# Patient Record
Sex: Male | Born: 1965 | Race: White | Hispanic: No | Marital: Married | Smoking: Never smoker
Health system: Southern US, Community
[De-identification: ages and names within clinical notes are randomized; demographics above are authoritative.]

## PROBLEM LIST (undated history)

## (undated) DIAGNOSIS — M542 Cervicalgia: Secondary | ICD-10-CM

## (undated) DIAGNOSIS — M519 Unspecified thoracic, thoracolumbar and lumbosacral intervertebral disc disorder: Secondary | ICD-10-CM

## (undated) DIAGNOSIS — G8929 Other chronic pain: Secondary | ICD-10-CM

## (undated) DIAGNOSIS — R945 Abnormal results of liver function studies: Secondary | ICD-10-CM

## (undated) DIAGNOSIS — I10 Essential (primary) hypertension: Secondary | ICD-10-CM

## (undated) DIAGNOSIS — R7989 Other specified abnormal findings of blood chemistry: Secondary | ICD-10-CM

## (undated) DIAGNOSIS — E119 Type 2 diabetes mellitus without complications: Secondary | ICD-10-CM

## (undated) DIAGNOSIS — R55 Syncope and collapse: Secondary | ICD-10-CM

## (undated) DIAGNOSIS — K5790 Diverticulosis of intestine, part unspecified, without perforation or abscess without bleeding: Secondary | ICD-10-CM

## (undated) HISTORY — DX: Unspecified thoracic, thoracolumbar and lumbosacral intervertebral disc disorder: M51.9

## (undated) HISTORY — PX: KNEE ARTHROCENTESIS: SUR44

## (undated) HISTORY — DX: Essential (primary) hypertension: I10

## (undated) HISTORY — DX: Type 2 diabetes mellitus without complications: E11.9

---

## 2007-11-29 ENCOUNTER — Emergency Department (HOSPITAL_COMMUNITY): Admission: EM | Admit: 2007-11-29 | Discharge: 2007-11-29 | Payer: Self-pay | Admitting: Emergency Medicine

## 2008-10-21 ENCOUNTER — Emergency Department: Payer: Self-pay | Admitting: Emergency Medicine

## 2010-09-06 HISTORY — PX: COLONOSCOPY: SHX174

## 2010-10-06 HISTORY — PX: COLONOSCOPY: SHX174

## 2012-06-07 HISTORY — PX: ROTATOR CUFF REPAIR W/ DISTAL CLAVICLE EXCISION: SHX2365

## 2012-11-24 ENCOUNTER — Ambulatory Visit (INDEPENDENT_AMBULATORY_CARE_PROVIDER_SITE_OTHER): Payer: BC Managed Care – PPO | Admitting: Gastroenterology

## 2012-11-24 ENCOUNTER — Encounter: Payer: Self-pay | Admitting: Gastroenterology

## 2012-11-24 VITALS — BP 116/76 | HR 76 | Temp 98.5°F | Ht 72.0 in | Wt 233.8 lb

## 2012-11-24 DIAGNOSIS — K6289 Other specified diseases of anus and rectum: Secondary | ICD-10-CM

## 2012-11-24 DIAGNOSIS — K649 Unspecified hemorrhoids: Secondary | ICD-10-CM

## 2012-11-24 MED ORDER — NITROGLYCERIN 0.4 % RE OINT: 1.0000 [in_us] | TOPICAL_OINTMENT | Freq: Two times a day (BID) | RECTAL | Status: DC

## 2012-11-24 MED ORDER — LIDOCAINE-HYDROCORTISONE ACE 3-1 % RE KIT: 1.0000 "application " | PACK | Freq: Two times a day (BID) | RECTAL | Status: DC

## 2012-11-24 NOTE — Assessment & Plan Note (Addendum)
47 y/o male with anorectal pain associated with BMs, intermittent rectal bleeding. TCS 2 years ago at [facility] reassuring. Patient describes having hemorrhoids that prolapse at times but current symptoms have been protracted regarding the pain. DRE limited due to patient discomfort. Suspect hemorrhoids +/- anorectal fissure. See patient instructions below.   1. First try Anamantle Forte rectally twice a day for two weeks. This has a numbing medication and antiinflammatory component to help calm down hemorrhoids. If you continue to have significant rectal pain, you may have a small tear in your rectum called a fissure. If you continue to have ongoing pain, you should then try Rectiv to relax your muscles and allowing for healing. This medication should be continued for at least 3-4 weeks.  2. If you do try Rectiv, be sure to use a gloved finger to apply the medication. You may need to take Tylenol before you use the medication to help prevent headache.  3. OV in 3 weeks. If doing better, consider scheduling for [facility] hemorrhoid banding.

## 2012-11-24 NOTE — Patient Instructions (Addendum)
1. First try Anamantle Forte rectally twice a day for two weeks. This has a numbing medication and antiinflammatory component to help calm down hemorrhoids. If you continue to have significant rectal pain, you may have a small tear in your rectum called a fissure. If you continue to have ongoing pain, you should then try Rectiv to relax your muscles and allowing for healing. This medication should be continued for at least 3-4 weeks.  2. If you do try Rectiv, be sure to use a gloved finger to apply the medication. You may need to take Tylenol before you use the medication to help prevent headache.   Anal Fissure, Adult An anal fissure is a small tear or crack in the skin around the anus. Bleeding from a fissure usually stops on its own within a few minutes. However, bleeding will often reoccur with each bowel movement until the crack heals.  CAUSES   Passing large, hard stools.  Frequent diarrheal stools.  Constipation.  Inflammatory bowel disease (Crohn's disease or ulcerative colitis).  Infections.  Anal sex. SYMPTOMS   Small amounts of blood seen on your stools, on toilet paper, or in the toilet after a bowel movement.  Rectal bleeding.  Painful bowel movements.  Itching or irritation around the anus. DIAGNOSIS Your caregiver will examine the anal area. An anal fissure can usually be seen with careful inspection. A rectal exam may be performed and a short tube (anoscope) may be used to examine the anal canal. TREATMENT   You may be instructed to take fiber supplements. These supplements can soften your stool to help make bowel movements easier.  Sitz baths may be recommended to help heal the tear. Do not use soap in the sitz baths.  A medicated cream or ointment may be prescribed to lessen discomfort. HOME CARE INSTRUCTIONS   Maintain a diet high in fruits, whole grains, and vegetables. Avoid constipating foods like bananas and dairy products.  Take sitz baths as directed  by your caregiver.  Drink enough fluids to keep your urine clear or pale yellow.  Only take over-the-counter or prescription medicines for pain, discomfort, or fever as directed by your caregiver. Do not take aspirin as this may increase bleeding.  Do not use ointments containing numbing medications (anesthetics) or hydrocortisone. They could slow healing. SEEK MEDICAL CARE IF:   Your fissure is not completely healed within 3 days.  You have further bleeding.  You have a fever.  You have diarrhea mixed with blood.  You have pain.  Your problem is getting worse rather than better. MAKE SURE YOU:   Understand these instructions.  Will watch your condition.  Will get help right away if you are not doing well or get worse. Document Released: 05/24/2005 Document Revised: 08/16/2011 Document Reviewed: 11/08/2010 [facility] Patient Information 2014 [place], [place].  Hemorrhoids Hemorrhoids are swollen veins around the rectum or anus. There are two types of hemorrhoids:   Internal hemorrhoids. These occur in the veins just inside the rectum. They may poke through to the outside and become irritated and painful.  External hemorrhoids. These occur in the veins outside the anus and can be felt as a painful swelling or hard lump near the anus. CAUSES  Pregnancy.   Obesity.   Constipation or diarrhea.   Straining to have a bowel movement.   Sitting for long periods on the toilet.  Heavy lifting or other activity that caused you to strain.  Anal intercourse. SYMPTOMS   Pain.   Anal itching  or irritation.   Rectal bleeding.   Fecal leakage.   Anal swelling.   One or more lumps around the anus.  DIAGNOSIS  Your caregiver may be able to diagnose hemorrhoids by visual examination. Other examinations or tests that may be performed include:   Examination of the rectal area with a gloved hand (digital rectal exam).   Examination of anal canal using a small  tube (scope).   A blood test if you have lost a significant amount of blood.  A test to look inside the colon (sigmoidoscopy or colonoscopy). TREATMENT Most hemorrhoids can be treated at home. However, if symptoms do not seem to be getting better or if you have a lot of rectal bleeding, your caregiver may perform a procedure to help make the hemorrhoids get smaller or remove them completely. Possible treatments include:   Placing a rubber band at the base of the hemorrhoid to cut off the circulation (rubber band ligation).   Injecting a chemical to shrink the hemorrhoid (sclerotherapy).   Using a tool to burn the hemorrhoid (infrared light therapy).   Surgically removing the hemorrhoid (hemorrhoidectomy).   Stapling the hemorrhoid to block blood flow to the tissue (hemorrhoid stapling).  HOME CARE INSTRUCTIONS   Eat foods with fiber, such as whole grains, beans, nuts, fruits, and vegetables. Ask your doctor about taking products with added fiber in them (fibersupplements).  Increase fluid intake. Drink enough water and fluids to keep your urine clear or pale yellow.   Exercise regularly.   Go to the bathroom when you have the urge to have a bowel movement. Do not wait.   Avoid straining to have bowel movements.   Keep the anal area dry and clean. Use wet toilet paper or moist towelettes after a bowel movement.   Medicated creams and suppositories may be used or applied as directed.   Only take over-the-counter or prescription medicines as directed by your caregiver.   Take warm sitz baths for 15 20 minutes, 3 4 times a day to ease pain and discomfort.   Place ice packs on the hemorrhoids if they are tender and swollen. Using ice packs between sitz baths may be helpful.   Put ice in a plastic bag.   Place a towel between your skin and the bag.   Leave the ice on for 15 20 minutes, 3 4 times a day.   Do not use a donut-shaped pillow or sit on the toilet  for long periods. This increases blood pooling and pain.  SEEK MEDICAL CARE IF:  You have increasing pain and swelling that is not controlled by treatment or medicine.  You have uncontrolled bleeding.  You have difficulty or you are unable to have a bowel movement.  You have pain or inflammation outside the area of the hemorrhoids. MAKE SURE YOU:  Understand these instructions.  Will watch your condition.  Will get help right away if you are not doing well or get worse. Document Released: 05/21/2000 Document Revised: 05/10/2012 Document Reviewed: 03/28/2012 [facility] Patient Information 2014 [place], [place].

## 2012-11-24 NOTE — Progress Notes (Signed)
Primary Care Physician:  Selinda Flavin, MD  Primary Gastroenterologist:  Roetta Sessions, MD   Chief Complaint  Patient presents with  . Hemorrhoids  . Rectal Pain    HPI:  Phillip Hodges is a 47 y.o. male here as self-referral for further evaluation of rectal pain, hemorrhoids. Colonoscopy 09/4010 by Dr. Mikal Plane after episode of uncomplicated diverticulitis was unremarkable except for sigmoid diverticula. Patient reports he woke up in middle of procedure due to terrible pain (Fentanyl , Versed 4mg ). Patient states he never had to take laxatives before his colonoscopy but now he takes daily Herbal laxative. He has been on oxycodone prn since shoulder injury and rotator cuff surgery six weeks ago. Two weeks ago hemorrhoids sticking out much more than normal. Lasted for one week. Before he only had hemorrhoid issues with heavy lifting. Pain just inside rectum, feels like razor blades. He denies any hard stools. He has used herbal laxative regularly to prevent opioid-induced constipation. Semi-soft stools. Hurts with BM. Fresh blood mixed in the stool, on the tissue. No melena. No abdominal, heartburn, dysphagia. No NSAIDS/ASA.   Has tried [employer], Prep-H suppositories. Nothing really gives relief.   Current Outpatient Prescriptions  Medication Sig Dispense Refill  . gabapentin (NEURONTIN) 300 MG capsule Take 300 mg by mouth 3 (three) times daily.      Marland Kitchen lisinopril-hydrochlorothiazide (PRINZIDE,ZESTORETIC) 10-12.5 MG per tablet Take 1 tablet by mouth daily.      . metFORMIN (GLUCOPHAGE) 500 MG tablet Take 1,000 mg by mouth 2 (two) times daily with a meal.       . oxyCODONE-acetaminophen (PERCOCET/ROXICET) 5-325 MG per tablet Take 1 tablet by mouth every 6 (six) hours as needed for pain.       No current facility-administered medications for this visit.    Allergies as of 11/24/2012 - Review Complete 11/24/2012  Allergen Reaction Noted  . Penicillins Rash 11/24/2012    Past  Medical History  Diagnosis Date  . HTN (hypertension)   . DM (diabetes mellitus)     Past Surgical History  Procedure Laterality Date  . Rotator cuff repair w/ distal clavicle excision  2014    right side  . Knee arthrocentesis      both knees    Family History  Problem Relation Age of Onset  . Colon cancer Paternal Grandmother     History   Social History  . Marital Status: Married    Spouse Name: N/A    Number of Children: N/A  . Years of Education: N/A   Occupational History  . Not on file.   Social History Main Topics  . Smoking status: Never Smoker   . Smokeless tobacco: Not on file  . Alcohol Use: Yes     Comment: socially  . Drug Use: No  . Sexually Active: Not on file   Other Topics Concern  . Not on file   Social History Narrative  . No narrative on file      ROS:  General: Negative for anorexia, weight loss, fever, chills, fatigue, weakness. Eyes: Negative for vision changes.  ENT: Negative for hoarseness, difficulty swallowing , nasal congestion. CV: Negative for chest pain, angina, palpitations, dyspnea on exertion, peripheral edema.  Respiratory: Negative for dyspnea at rest, dyspnea on exertion, cough, sputum, wheezing.  GI: See history of present illness. GU:  Negative for dysuria, hematuria, urinary incontinence, urinary frequency, nocturnal urination.  MS: Positive bilateral shoulder pain. Negative for low back pain.  Derm: Negative for rash or  itching.  Neuro: Negative for weakness, abnormal sensation, seizure, frequent headaches, memory loss, confusion.  Psych: Negative for anxiety, depression, suicidal ideation, hallucinations.  Endo: Negative for unusual weight change.  Heme: Negative for bruising or bleeding. Allergy: Negative for rash or hives.    Physical Examination:  BP 116/76  Pulse 76  Temp(Src) 98.5 F (36.9 C) (Oral)  Ht 6' (1.829 m)  Wt 233 lb 12.8 oz (106.051 kg)  BMI 31.7 kg/m2   General: Well-nourished,  well-developed in no acute distress.  Head: Normocephalic, atraumatic.   Eyes: Conjunctiva pink, no icterus. Mouth: Oropharyngeal mucosa moist and pink , no lesions erythema or exudate. Neck: Supple without thyromegaly, masses, or lymphadenopathy.  Lungs: Clear to auscultation bilaterally.  Heart: Regular rate and rhythm, no murmurs rubs or gallops.  Abdomen: Bowel sounds are normal, nontender, nondistended, no hepatosplenomegaly or masses, no abdominal bruits or    hernia , no rebound or guarding.   Rectal: No abnormalities externally. No protrusion of hemorrhoids with bearing down. Very tender digital exam especially posteriorly. Could not complete exam due to patient discomfort. Brown secretions Hemoccult negative. Extremities: No lower extremity edema. No clubbing or deformities.  Neuro: Alert and oriented x 4 , grossly normal neurologically.  Skin: Warm and dry, no rash or jaundice.   Psych: Alert and cooperative, normal mood and affect.

## 2012-11-27 NOTE — Progress Notes (Signed)
Cc PCP 

## 2012-12-13 ENCOUNTER — Encounter: Payer: Self-pay | Admitting: Gastroenterology

## 2012-12-13 NOTE — Progress Notes (Signed)
Receipt copy of CT scan from April 2012. Patient had short segment colitis versus diverticulitis, otherwise unremarkable study.

## 2012-12-18 ENCOUNTER — Ambulatory Visit: Payer: BC Managed Care – PPO | Admitting: Gastroenterology

## 2012-12-18 ENCOUNTER — Telehealth: Payer: Self-pay | Admitting: Gastroenterology

## 2012-12-18 NOTE — Telephone Encounter (Signed)
Pt was a no show

## 2015-07-31 ENCOUNTER — Encounter (HOSPITAL_BASED_OUTPATIENT_CLINIC_OR_DEPARTMENT_OTHER): Payer: Self-pay | Admitting: *Deleted

## 2015-07-31 ENCOUNTER — Emergency Department (HOSPITAL_BASED_OUTPATIENT_CLINIC_OR_DEPARTMENT_OTHER): Payer: Worker's Compensation

## 2015-07-31 ENCOUNTER — Emergency Department (HOSPITAL_BASED_OUTPATIENT_CLINIC_OR_DEPARTMENT_OTHER)
Admission: EM | Admit: 2015-07-31 | Discharge: 2015-07-31 | Disposition: A | Discharge status: Home / Self Care | Payer: Worker's Compensation | Attending: Emergency Medicine | Admitting: Emergency Medicine

## 2015-07-31 DIAGNOSIS — Z7984 Long term (current) use of oral hypoglycemic drugs: Secondary | ICD-10-CM | POA: Diagnosis not present

## 2015-07-31 DIAGNOSIS — S99912A Unspecified injury of left ankle, initial encounter: Secondary | ICD-10-CM | POA: Diagnosis present

## 2015-07-31 DIAGNOSIS — Y9301 Activity, walking, marching and hiking: Secondary | ICD-10-CM | POA: Diagnosis not present

## 2015-07-31 DIAGNOSIS — Z79899 Other long term (current) drug therapy: Secondary | ICD-10-CM | POA: Diagnosis not present

## 2015-07-31 DIAGNOSIS — Y9289 Other specified places as the place of occurrence of the external cause: Secondary | ICD-10-CM | POA: Insufficient documentation

## 2015-07-31 DIAGNOSIS — E119 Type 2 diabetes mellitus without complications: Secondary | ICD-10-CM | POA: Diagnosis not present

## 2015-07-31 DIAGNOSIS — I10 Essential (primary) hypertension: Secondary | ICD-10-CM | POA: Insufficient documentation

## 2015-07-31 DIAGNOSIS — Y998 Other external cause status: Secondary | ICD-10-CM | POA: Insufficient documentation

## 2015-07-31 DIAGNOSIS — Z880 Allergy status to penicillin: Secondary | ICD-10-CM | POA: Insufficient documentation

## 2015-07-31 DIAGNOSIS — S93402A Sprain of unspecified ligament of left ankle, initial encounter: Secondary | ICD-10-CM | POA: Insufficient documentation

## 2015-07-31 DIAGNOSIS — X500XXA Overexertion from strenuous movement or load, initial encounter: Secondary | ICD-10-CM | POA: Diagnosis not present

## 2015-07-31 NOTE — ED Provider Notes (Signed)
CSN: [id number]     Arrival date & time 07/31/15  1444 History   First MD Initiated Contact with Patient 07/31/15 1525     Chief Complaint  Patient presents with  . Ankle Injury     (Consider location/radiation/quality/duration/timing/severity/associated sxs/prior Treatment) Patient is a 50 y.o. male presenting with lower extremity injury. The history is provided by the patient and medical records. No language interpreter was used.  Ankle Injury Associated symptoms include arthralgias and joint swelling.  Phillip Hodges is a 50 y.o. male  with a PMH of HTN, DM who presents to the Emergency Department for acute onset of left ankle pain after injury at work today just PTA. Patient states he was walking when he placed foot on step oddly, leading to ankle inversion. + swelling. Denies numbness, tingling. Denies prior injury or surgery to left ankle. No medication taken PTA.   Past Medical History  Diagnosis Date  . HTN (hypertension)   . DM (Phillip Hodges) ([place])    Past Surgical History  Procedure Laterality Date  . Rotator cuff repair w/ distal clavicle excision  2014    right side  . Knee arthrocentesis      both knees  . Colonoscopy  April 2012    Dr. Yaakov Guthrie sigmoid diverticula   Family History  Problem Relation Age of Onset  . Colon cancer Paternal Grandmother    Social History  Substance Use Topics  . Smoking status: Never Smoker   . Smokeless tobacco: None  . Alcohol Use: Yes     Comment: socially    Review of Systems  Musculoskeletal: Positive for joint swelling and arthralgias.  Skin: Negative for color change.      Allergies  Penicillins  Home Medications   Prior to Admission medications   Medication Sig Start Date End Date Taking? Authorizing Provider  gabapentin (NEURONTIN) 300 MG capsule Take 300 mg by mouth 3 (three) times daily.    Historical Provider, MD  lidocaine-hydrocortisone (ANAMANTLE) 3-1 % KIT Place 1 application rectally 2 (two)  times daily. 11/24/12   Phillip Menghini, PA-C  lisinopril-hydrochlorothiazide (PRINZIDE,ZESTORETIC) 10-12.5 MG per tablet Take 1 tablet by mouth daily.    Historical Provider, MD  metFORMIN (GLUCOPHAGE) 500 MG tablet Take 1,000 mg by mouth 2 (two) times daily with a meal.     Historical Provider, MD  Nitroglycerin (RECTIV) 0.4 % OINT Place 1 inch rectally every 12 (twelve) hours. Using gloved finger. 11/24/12   Phillip Menghini, PA-C  oxyCODONE-acetaminophen (PERCOCET/ROXICET) 5-325 MG per tablet Take 1 tablet by mouth every 6 (six) hours as needed for pain.    Historical Provider, MD   BP 147/99 mmHg  Pulse 78  Temp(Src) 98.3 F (36.8 C) (Oral)  Resp 20  Ht 6' (1.829 m)  Wt 106.142 kg  BMI 31.73 kg/m2  SpO2 98% Physical Exam  Constitutional: He is oriented to person, place, and time. He appears well-developed and well-nourished.  Alert and in no acute distress  HENT:  Head: Normocephalic and atraumatic.  Cardiovascular: Normal rate, regular rhythm, normal heart sounds and intact distal pulses.  Exam reveals no gallop and no friction rub.   No murmur heard. Pulmonary/Chest: Effort normal and breath sounds normal. No respiratory distress. He has no wheezes. He has no rales. He exhibits no tenderness.  Abdominal: There is no guarding.  Musculoskeletal:  Foot/Ankle: No gross deformity noted. Patient has full active and passive range of motion. There is no joint effusion noted. No erythema, or warmth  overlaying the joint. There is tenderness to palpation over the ATFL. No pain to fifth metatarsal area, no pain to navicular region. 2+ DP pulses, sensation intact to medial, lateral, dorsal and plantar aspects. Good cap refill.   Neurological: He is alert and oriented to person, place, and time.  Skin: Skin is warm and dry. No rash noted.  Psychiatric: He has a normal mood and affect. His behavior is normal. Judgment and thought content normal.  Nursing note and vitals reviewed.   ED Course   Procedures (including critical care time) Labs Review Labs Reviewed - No data to display  Imaging Review Dg Ankle Complete Left  07/31/2015  CLINICAL DATA:  Rolled ankle at work today, lateral pain and swelling EXAM: LEFT ANKLE COMPLETE - 3+ VIEW COMPARISON:  None. FINDINGS: Three views of the left ankle submitted. No acute fracture or subluxation. Ankle mortise is preserved. Well corticated bony fragments adjacent to distal tibia medial malleolus most likely from prior injury. There is mild spurring of distal fibula. Soft tissue swelling adjacent to lateral malleolus. IMPRESSION: No acute fracture or subluxation. Ankle mortise is preserved. Lateral soft tissue swelling. Well corticated small bony fragments adjacent to tip of distal tibia most likely from prior injury. Electronically Signed   By: Lahoma Crocker M.D.   On: 07/31/2015 15:33   I have personally reviewed and evaluated these images and lab results as part of my medical decision-making.   EKG Interpretation None      MDM   Final diagnoses:  Ankle sprain, left, initial encounter   Phillip Hodges presents after ankle inversion injury at work just PTA. X-ray with no acute abnormalities. Exam c/w ankle sprain. Ace wrap given in ED. RICE and NSAID home care discussed. Offered crutches -- patient declined. Return precautions and follow up instructions given. All questions answered.   [facility] Ward, PA-C 07/31/15 1559  Tanna Furry, MD 08/08/15 [phone]

## 2015-07-31 NOTE — ED Notes (Signed)
He twisted his left ankle at work today. Workman's comp. He needs a drug screen.

## 2015-07-31 NOTE — Discharge Instructions (Signed)
Be sure to read and understand instructions below prior to leaving the hospital. If your symptoms persist without any improvement in 1 week it is recommended that you follow up with the orthopedist listed above. Take ibuprofen three times daily for two days to decrease inflammation. Then, just as needed for pain relief.  Weight bearing as tolerated.   Ankle Sprain  An ankle sprain is an injury to the ligaments that hold the ankle joint together. Your X-ray today showed no evidence of fracture, however keep all follow-up appointments with an orthopedic specialist to have follow-up X-rays, because fractures may not appear until 3 days after the acute injury.    TREATMENT  Rest, ice, elevation, and compression are the basic modes of treatment.    HOME CARE INSTRUCTIONS  Apply ice to the sore area for 15 to 20 minutes, 3 to 4 times per day. Do this while you are awake for the first 2 days. This can be stopped when the swelling goes away. Put the ice in a plastic bag and place a towel between the bag of ice and your skin.  Keep your leg elevated when possible to lessen swelling.  If your caregiver recommends crutches, use them as instructed for 1 week. Then, you may walk on your ankle weight bearing as tolerated.  You may take off your ankle stabilizer at night and to take a shower or bath. Wiggle your toes in the splint several times per day if you are able.  Do not drive a vehicle on pain medication. ACTIVITY:            - Weight bearing as tolerated            - Exercises should be limited to pain free range of motion            - Can start mobilization by tracing the alphabet with your foot in the air.       SEEK MEDICAL CARE IF:  You have an increase in bruising, swelling, or pain.  Your toes feel cold.  Pain relief is not achieved with medications.  EMERGENCY:: Your toes are numb or blue or you have severe pain.   MAKE SURE YOU:  Understand these instructions.  Will watch your  condition.  Will get help right away if you are not doing well or get worse   COLD THERAPY DIRECTIONS:  Ice or gel packs can be used to reduce both pain and swelling. Ice is the most helpful within the first 24 to 48 hours after an injury or flareup from overusing a muscle or joint.  Ice is effective, has very few side effects, and is safe for most people to use.   If you expose your skin to cold temperatures for too long or without the proper protection, you can damage your skin or nerves. Watch for signs of skin damage due to cold.   HOME CARE INSTRUCTIONS  Follow these tips to use ice and cold packs safely.  Place a dry or damp towel between the ice and skin. A damp towel will cool the skin more quickly, so you may need to shorten the time that the ice is used.  For a more rapid response, add gentle compression to the ice.  Ice for no more than 10 to 20 minutes at a time. The bonier the area you are icing, the less time it will take to get the benefits of ice.  Check your skin after 5 minutes to make  sure there are no signs of a poor response to cold or skin damage.  Rest 20 minutes or more in between uses.  Once your skin is numb, you can end your treatment. You can test numbness by very lightly touching your skin. The touch should be so light that you do not see the skin dimple from the pressure of your fingertip. When using ice, most people will feel these normal sensations in this order: cold, burning, aching, and numbness.

## 2015-10-13 ENCOUNTER — Encounter (HOSPITAL_COMMUNITY): Payer: Self-pay | Admitting: Emergency Medicine

## 2015-10-13 ENCOUNTER — Emergency Department (HOSPITAL_COMMUNITY)
Admission: EM | Admit: 2015-10-13 | Discharge: 2015-10-13 | Disposition: A | Discharge status: Home / Self Care | Payer: No Typology Code available for payment source | Attending: Emergency Medicine | Admitting: Emergency Medicine

## 2015-10-13 ENCOUNTER — Emergency Department (HOSPITAL_COMMUNITY): Payer: No Typology Code available for payment source

## 2015-10-13 DIAGNOSIS — S0990XA Unspecified injury of head, initial encounter: Secondary | ICD-10-CM | POA: Insufficient documentation

## 2015-10-13 DIAGNOSIS — M542 Cervicalgia: Secondary | ICD-10-CM | POA: Diagnosis not present

## 2015-10-13 DIAGNOSIS — R55 Syncope and collapse: Secondary | ICD-10-CM | POA: Diagnosis not present

## 2015-10-13 DIAGNOSIS — Y9389 Activity, other specified: Secondary | ICD-10-CM | POA: Diagnosis not present

## 2015-10-13 DIAGNOSIS — Y999 Unspecified external cause status: Secondary | ICD-10-CM | POA: Diagnosis not present

## 2015-10-13 DIAGNOSIS — E119 Type 2 diabetes mellitus without complications: Secondary | ICD-10-CM | POA: Diagnosis not present

## 2015-10-13 DIAGNOSIS — Y929 Unspecified place or not applicable: Secondary | ICD-10-CM | POA: Diagnosis not present

## 2015-10-13 DIAGNOSIS — G8929 Other chronic pain: Secondary | ICD-10-CM | POA: Diagnosis not present

## 2015-10-13 DIAGNOSIS — W228XXA Striking against or struck by other objects, initial encounter: Secondary | ICD-10-CM | POA: Diagnosis not present

## 2015-10-13 DIAGNOSIS — I10 Essential (primary) hypertension: Secondary | ICD-10-CM | POA: Diagnosis not present

## 2015-10-13 HISTORY — DX: Abnormal results of liver function studies: R94.5

## 2015-10-13 HISTORY — DX: Cervicalgia: M54.2

## 2015-10-13 HISTORY — DX: Other chronic pain: G89.29

## 2015-10-13 HISTORY — DX: Other specified abnormal findings of blood chemistry: R79.89

## 2015-10-13 HISTORY — DX: Syncope and collapse: R55

## 2015-10-13 HISTORY — DX: Diverticulosis of intestine, part unspecified, without perforation or abscess without bleeding: K57.90

## 2015-10-13 LAB — COMPREHENSIVE METABOLIC PANEL
ALT: 141 U/L — AB (ref 17–63)
AST: 125 U/L — AB (ref 15–41)
Albumin: 4.9 g/dL (ref 3.5–5.0)
Alkaline Phosphatase: 116 U/L (ref 38–126)
Anion gap: 14 (ref 5–15)
BUN: 12 mg/dL (ref 6–20)
CHLORIDE: 94 mmol/L — AB (ref 101–111)
CO2: 25 mmol/L (ref 22–32)
CREATININE: 0.81 mg/dL (ref 0.61–1.24)
Calcium: 9.4 mg/dL (ref 8.9–10.3)
GFR calc Af Amer: >60 mL/min (ref >60)
GFR calc non Af Amer: >60 mL/min (ref >60)
Glucose, Bld: 220 mg/dL — ABNORMAL HIGH (ref 65–99)
POTASSIUM: 4.1 mmol/L (ref 3.5–5.1)
SODIUM: 133 mmol/L — AB (ref 135–145)
Total Bilirubin: 1.8 mg/dL — ABNORMAL HIGH (ref 0.3–1.2)
Total Protein: 8.6 g/dL — ABNORMAL HIGH (ref 6.5–8.1)

## 2015-10-13 LAB — CBC WITH DIFFERENTIAL/PLATELET
BASOS ABS: 0 10*3/uL (ref 0.0–0.1)
BASOS PCT: 0 %
EOS PCT: 3 %
Eosinophils Absolute: 0.2 10*3/uL (ref 0.0–0.7)
HCT: 45.7 % (ref 39.0–52.0)
Hemoglobin: 16.1 g/dL (ref 13.0–17.0)
Lymphocytes Relative: 29 %
Lymphs Abs: 2.3 10*3/uL (ref 0.7–4.0)
MCH: 29.6 pg (ref 26.0–34.0)
MCHC: 35.2 g/dL (ref 30.0–36.0)
MCV: 84 fL (ref 78.0–100.0)
Monocytes Absolute: 0.7 10*3/uL (ref 0.1–1.0)
Monocytes Relative: 9 %
Neutro Abs: 4.7 10*3/uL (ref 1.7–7.7)
Neutrophils Relative %: 59 %
PLATELETS: 220 10*3/uL (ref 150–400)
RBC: 5.44 MIL/uL (ref 4.22–5.81)
RDW: 12.4 % (ref 11.5–15.5)
WBC: 8 10*3/uL (ref 4.0–10.5)

## 2015-10-13 LAB — I-STAT TROPONIN, ED: Troponin i, poc: 0 ng/mL (ref 0.00–0.08)

## 2015-10-13 MED ORDER — METHOCARBAMOL 500 MG PO TABS: 1000.0000 mg | ORAL_TABLET | Freq: Four times a day (QID) | ORAL | Status: DC | PRN

## 2015-10-13 MED ORDER — HYDROCODONE-ACETAMINOPHEN 5-325 MG PO TABS: ORAL_TABLET | ORAL | Status: DC

## 2015-10-13 NOTE — Discharge Instructions (Signed)
Take the prescriptions as directed.  Apply moist heat or ice to the area(s) of discomfort, for 15 minutes at a time, several times per day for the next few days.  Do not fall asleep on a heating or ice pack.  Call your regular medical doctor tomorrow to schedule a follow up appointment within the next 2 days. Call the Neurologist, the GI doctor, and the Cardiologist tomorrow to schedule follow up appointments within the next week.  Return to the Emergency Department immediately if worsening.
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
## 2015-10-13 NOTE — Progress Notes (Signed)
2044 Beeper time 2054 exam started  2059exam finished 2059called [place] [facility]

## 2015-10-13 NOTE — ED Notes (Signed)
Pt alert & oriented x4, stable gait. Patient given discharge instructions, paperwork & prescription(s). Patient  instructed to stop at the registration desk to finish any additional paperwork. Patient verbalized understanding. Pt left department w/ no further questions. 
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
## 2015-10-30 ENCOUNTER — Ambulatory Visit (INDEPENDENT_AMBULATORY_CARE_PROVIDER_SITE_OTHER): Payer: 59 | Admitting: Neurology

## 2015-10-30 ENCOUNTER — Encounter: Payer: Self-pay | Admitting: Neurology

## 2015-10-30 VITALS — BP 129/84 | HR 68 | Ht 72.0 in | Wt 227.0 lb

## 2015-10-30 DIAGNOSIS — R5382 Chronic fatigue, unspecified: Secondary | ICD-10-CM

## 2015-10-30 DIAGNOSIS — G8929 Other chronic pain: Secondary | ICD-10-CM | POA: Diagnosis not present

## 2015-10-30 DIAGNOSIS — R413 Other amnesia: Secondary | ICD-10-CM | POA: Diagnosis not present

## 2015-10-30 DIAGNOSIS — R42 Dizziness and giddiness: Secondary | ICD-10-CM | POA: Diagnosis not present

## 2015-10-30 DIAGNOSIS — E538 Deficiency of other specified B group vitamins: Secondary | ICD-10-CM

## 2015-10-30 DIAGNOSIS — R55 Syncope and collapse: Secondary | ICD-10-CM

## 2015-10-30 DIAGNOSIS — E0865 Diabetes mellitus due to underlying condition with hyperglycemia: Secondary | ICD-10-CM | POA: Diagnosis not present

## 2015-10-30 DIAGNOSIS — G4489 Other headache syndrome: Secondary | ICD-10-CM | POA: Diagnosis not present

## 2015-10-30 DIAGNOSIS — E0849 Diabetes mellitus due to underlying condition with other diabetic neurological complication: Secondary | ICD-10-CM | POA: Diagnosis not present

## 2015-10-30 DIAGNOSIS — W57XXXA Bitten or stung by nonvenomous insect and other nonvenomous arthropods, initial encounter: Secondary | ICD-10-CM

## 2015-10-30 DIAGNOSIS — IMO0002 Reserved for concepts with insufficient information to code with codable children: Secondary | ICD-10-CM

## 2015-10-30 NOTE — Patient Instructions (Addendum)
Remember to drink plenty of fluid, eat healthy meals and do not skip any meals. Try to eat protein with a every meal and eat a healthy snack such as fruit or nuts in between meals. Try to keep a regular sleep-wake schedule and try to exercise daily, particularly in the form of walking, 20-30 minutes a day, if you can.   As far as diagnostic testing: MRI of the brain, eeg and labs  Our phone number is [phone]. We also have an after hours call service for urgent matters and there is a physician on-call for urgent questions. For any emergencies you know to call 911 or go to the nearest emergency room  Patient is unable to drive, operate heavy machinery, perform activities at heights or participate in water activities until 6 months event free
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
## 2015-10-31 ENCOUNTER — Encounter: Payer: Self-pay | Admitting: Neurology

## 2015-10-31 DIAGNOSIS — IMO0002 Reserved for concepts with insufficient information to code with codable children: Secondary | ICD-10-CM | POA: Insufficient documentation

## 2015-10-31 DIAGNOSIS — E1165 Type 2 diabetes mellitus with hyperglycemia: Secondary | ICD-10-CM | POA: Insufficient documentation

## 2015-10-31 DIAGNOSIS — G8929 Other chronic pain: Secondary | ICD-10-CM | POA: Insufficient documentation

## 2015-11-05 ENCOUNTER — Telehealth: Payer: Self-pay | Admitting: Neurology

## 2015-11-05 LAB — HEMOGLOBIN A1C
ESTIMATED AVERAGE GLUCOSE: 252 mg/dL
HEMOGLOBIN A1C: 10.4 % — AB (ref 4.8–5.6)

## 2015-11-05 LAB — THYROID PANEL WITH TSH
FREE THYROXINE INDEX: 1.8 (ref 1.2–4.9)
T3 Uptake Ratio: 21 % — ABNORMAL LOW (ref 24–39)
T4, Total: 8.5 ug/dL (ref 4.5–12.0)
TSH: 1.49 u[IU]/mL (ref 0.450–4.500)

## 2015-11-05 LAB — B12 AND FOLATE PANEL
Folate: 12.4 ng/mL (ref >3.0)
VITAMIN B 12: 424 pg/mL (ref 211–946)

## 2015-11-05 LAB — METHYLMALONIC ACID, SERUM: Methylmalonic Acid: 144 nmol/L (ref 0–378)

## 2015-11-05 LAB — B. BURGDORFI ANTIBODIES

## 2015-11-05 NOTE — Telephone Encounter (Signed)
Dr Lucia GaskinsAhern- please advise. Pt was just seen in office on 10/30/15

## 2015-11-05 NOTE — Telephone Encounter (Signed)
Called and spoke to Phillip Hodges. Relayed lab results per Dr Lucia GaskinsAhern that Hemoglobin A1C 10.4 and this is very elevated. Average blood glucose daily is 252 over the last 3 months. He needs to contact PCP to try and get appt this week to f/u on this. SHe verbalized understanding.

## 2015-11-05 NOTE — Telephone Encounter (Signed)
Patient's glucose is uncontrolled, this can cause dizziness and double vision. Ask him to follow up with primary care this week.

## 2015-11-05 NOTE — Telephone Encounter (Signed)
Wife called to advise, "husband was off 6 days in a row for vacation, had planned to go to the beach, was unable to go because he wasn't feeling up to it, was in bed pretty much the whole time". Husband is having more dizzy spells, more fatigue and having double vision.

## 2015-11-27 ENCOUNTER — Ambulatory Visit (INDEPENDENT_AMBULATORY_CARE_PROVIDER_SITE_OTHER): Payer: 59 | Admitting: Neurology

## 2015-11-27 ENCOUNTER — Telehealth: Payer: Self-pay | Admitting: Neurology

## 2015-11-27 DIAGNOSIS — R55 Syncope and collapse: Secondary | ICD-10-CM

## 2015-11-27 DIAGNOSIS — R42 Dizziness and giddiness: Secondary | ICD-10-CM
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
## 2015-11-27 NOTE — Telephone Encounter (Signed)
EEG normal. I would like a 3-day extended EEG at home, please ask if he is willing thanks

## 2015-11-28 ENCOUNTER — Telehealth: Payer: Self-pay | Admitting: Neurology

## 2015-11-28 NOTE — Telephone Encounter (Signed)
EEG normal, but a one hour EEG does not rule out seizure/epilepsy. I would like him to have a 3-day eeg so we can monitor him over an extended period of time. Please discuss with him thanks. Ambulatory, at home.

## 2015-12-01 NOTE — Telephone Encounter (Signed)
Tried calling pt. Mailbox full, unable to LVM. Will try again later.

## 2015-12-01 NOTE — Telephone Encounter (Signed)
Tried work number. LVM for pt to call office back. Advised I tried home number listed but VM full. Gave [facility] phone number.

## 2015-12-01 NOTE — Telephone Encounter (Signed)
Tried calling home number again. Went to VM. Unable to LVM d/t full VM. Will try work number

## 2015-12-03 NOTE — Telephone Encounter (Signed)
Pt's wife returned Phillip Hodges's call °

## 2015-12-03 NOTE — Telephone Encounter (Signed)
Faxed completed form to [facility] to schedule pt for in-home 72-hour EEG. Fax: [phone]. Received confirmation.   

## 2015-12-03 NOTE — Telephone Encounter (Signed)
Called and spoke to pt wife about EEG results. She verbalized understanding and will let pt know. In agreement to do 3-day in home EEG.  Advised we will send order to [facility] for a 3-day in home EEG. They will call to schedule. She asked they call [phone].  Advised I will put on form for them to call this number.

## 2015-12-15 ENCOUNTER — Other Ambulatory Visit: Payer: Self-pay

## 2015-12-15 ENCOUNTER — Ambulatory Visit
Admission: RE | Admit: 2015-12-15 | Discharge: 2015-12-15 | Disposition: A | Discharge status: Home / Self Care | Payer: PRIVATE HEALTH INSURANCE | Source: Ambulatory Visit | Attending: Neurology | Admitting: Neurology

## 2015-12-15 ENCOUNTER — Other Ambulatory Visit: Payer: Self-pay | Admitting: Neurology

## 2015-12-15 DIAGNOSIS — G4489 Other headache syndrome: Secondary | ICD-10-CM

## 2015-12-15 DIAGNOSIS — H0553 Retained (old) foreign body following penetrating wound of bilateral orbits: Secondary | ICD-10-CM

## 2015-12-15 DIAGNOSIS — R42 Dizziness and giddiness: Secondary | ICD-10-CM

## 2015-12-15 DIAGNOSIS — R55 Syncope and collapse: Secondary | ICD-10-CM

## 2015-12-15 DIAGNOSIS — Z189 Retained foreign body fragments, unspecified material: Principal | ICD-10-CM

## 2015-12-15 MED ORDER — GADOBENATE DIMEGLUMINE 529 MG/ML IV SOLN
20.0000 mL | Freq: Once | INTRAVENOUS | Status: AC | PRN
  Administered 2015-12-15: 20 mL via INTRAVENOUS

## 2015-12-16 ENCOUNTER — Telehealth: Payer: Self-pay | Admitting: *Deleted

## 2015-12-16 NOTE — Telephone Encounter (Signed)
-----   Message from Anson FretAntonia B Ahern, MD sent at 12/16/2015  3:37 PM EDT ----- MRI of the brain is normal thanks

## 2015-12-16 NOTE — Telephone Encounter (Signed)
Called and LVM for pt about normal MRI brain per Dr Lucia GaskinsAhern. Gave [facility] phone number if he has further questions.

## 2016-06-22 DIAGNOSIS — E1165 Type 2 diabetes mellitus with hyperglycemia: Secondary | ICD-10-CM | POA: Diagnosis not present

## 2016-06-22 DIAGNOSIS — I10 Essential (primary) hypertension: Secondary | ICD-10-CM | POA: Diagnosis not present

## 2016-06-22 DIAGNOSIS — G894 Chronic pain syndrome: Secondary | ICD-10-CM | POA: Diagnosis not present

## 2016-06-22 DIAGNOSIS — E291 Testicular hypofunction: Secondary | ICD-10-CM | POA: Diagnosis not present

## 2016-07-27 DIAGNOSIS — G8929 Other chronic pain: Secondary | ICD-10-CM | POA: Diagnosis not present

## 2016-07-27 DIAGNOSIS — M47816 Spondylosis without myelopathy or radiculopathy, lumbar region: Secondary | ICD-10-CM | POA: Diagnosis not present

## 2016-08-03 DIAGNOSIS — I10 Essential (primary) hypertension: Secondary | ICD-10-CM | POA: Diagnosis not present

## 2016-08-26 DIAGNOSIS — Z79891 Long term (current) use of opiate analgesic: Secondary | ICD-10-CM | POA: Diagnosis not present

## 2016-09-23 DIAGNOSIS — G8929 Other chronic pain: Secondary | ICD-10-CM | POA: Diagnosis not present

## 2016-09-23 DIAGNOSIS — Z79891 Long term (current) use of opiate analgesic: Secondary | ICD-10-CM | POA: Diagnosis not present

## 2016-10-14 DIAGNOSIS — R55 Syncope and collapse: Secondary | ICD-10-CM | POA: Diagnosis not present

## 2016-10-14 DIAGNOSIS — I10 Essential (primary) hypertension: Secondary | ICD-10-CM | POA: Diagnosis not present

## 2016-10-14 DIAGNOSIS — E1165 Type 2 diabetes mellitus with hyperglycemia: Secondary | ICD-10-CM | POA: Diagnosis not present

## 2016-10-15 ENCOUNTER — Emergency Department (HOSPITAL_BASED_OUTPATIENT_CLINIC_OR_DEPARTMENT_OTHER)
Admission: EM | Admit: 2016-10-15 | Discharge: 2016-10-15 | Disposition: A | Discharge status: Home / Self Care | Payer: Worker's Compensation | Attending: Emergency Medicine | Admitting: Emergency Medicine

## 2016-10-15 ENCOUNTER — Emergency Department (HOSPITAL_BASED_OUTPATIENT_CLINIC_OR_DEPARTMENT_OTHER): Payer: Worker's Compensation

## 2016-10-15 ENCOUNTER — Encounter (HOSPITAL_BASED_OUTPATIENT_CLINIC_OR_DEPARTMENT_OTHER): Payer: Self-pay

## 2016-10-15 DIAGNOSIS — S199XXA Unspecified injury of neck, initial encounter: Secondary | ICD-10-CM | POA: Diagnosis present

## 2016-10-15 DIAGNOSIS — Z79899 Other long term (current) drug therapy: Secondary | ICD-10-CM | POA: Diagnosis not present

## 2016-10-15 DIAGNOSIS — E119 Type 2 diabetes mellitus without complications: Secondary | ICD-10-CM | POA: Diagnosis not present

## 2016-10-15 DIAGNOSIS — Y990 Civilian activity done for income or pay: Secondary | ICD-10-CM | POA: Insufficient documentation

## 2016-10-15 DIAGNOSIS — X58XXXA Exposure to other specified factors, initial encounter: Secondary | ICD-10-CM | POA: Insufficient documentation

## 2016-10-15 DIAGNOSIS — I10 Essential (primary) hypertension: Secondary | ICD-10-CM | POA: Diagnosis not present

## 2016-10-15 DIAGNOSIS — Z7984 Long term (current) use of oral hypoglycemic drugs: Secondary | ICD-10-CM | POA: Diagnosis not present

## 2016-10-15 DIAGNOSIS — S161XXA Strain of muscle, fascia and tendon at neck level, initial encounter: Secondary | ICD-10-CM | POA: Diagnosis not present

## 2016-10-15 DIAGNOSIS — Y929 Unspecified place or not applicable: Secondary | ICD-10-CM | POA: Diagnosis not present

## 2016-10-15 DIAGNOSIS — Y939 Activity, unspecified: Secondary | ICD-10-CM | POA: Diagnosis not present

## 2016-10-15 MED ORDER — KETOROLAC TROMETHAMINE 10 MG PO TABS: 10.0000 mg | ORAL_TABLET | Freq: Four times a day (QID) | ORAL | 0 refills | Status: DC | PRN

## 2016-10-15 MED ORDER — CYCLOBENZAPRINE HCL 10 MG PO TABS: 10.0000 mg | ORAL_TABLET | Freq: Two times a day (BID) | ORAL | 0 refills | Status: AC | PRN

## 2016-10-15 MED ORDER — KETOROLAC TROMETHAMINE 30 MG/ML IJ SOLN
30.0000 mg | Freq: Once | INTRAMUSCULAR | Status: AC
  Administered 2016-10-15: 30 mg via INTRAMUSCULAR
  Filled 2016-10-15: qty 1

## 2016-10-15 MED FILL — CYCLOBENZAPRINE 10 MG TAB: 10 | 10 days supply | Qty: 20 | Fill #0

## 2016-10-15 MED FILL — KETOROLAC 10 MG TABLET: 10 | 2 days supply | Qty: 10 | Fill #0

## 2016-10-15 NOTE — ED Notes (Signed)
This rn speaks with pt supervisor by phone to clarify occupational health testing needs. Supervisor states pt need uds, but not bat.

## 2016-10-15 NOTE — ED Triage Notes (Signed)
c/o posterior neck pain after "prying some machinery at work" today-NAD-steady gait-pt supervisor Cyndi LennertRichard Everson states pt needs UDS and BAC

## 2016-10-15 NOTE — ED Provider Notes (Signed)
[facility]   CSN: [id number] Arrival date & time: 10/15/16  1132     History   Chief Complaint Chief Complaint  Patient presents with  . Neck Pain    HPI SABIEN UMLAND is a 51 y.o. male.  Pt c/o neck pain that started after prying some machinery at work.  Pt has a hx of chronic neck pain has a pain med contract with his doctor.  He does have occ numbness in his left hand which happens periodically (with prior neck issues).  He had some left hand numbness earlier today, but none now.  No new numbness.       Past Medical History:  Diagnosis Date  . Chronic neck pain    "bad discs in my neck"  . Disc disorder    buldging  . Diverticulosis   . DM (diabetes mellitus) (HCC)   . Elevated LFTs    "for the past 20 years"  . HTN (hypertension)   . Syncope    recurrent syncope and near syncope "for at least 10 years"    Patient Active Problem List   Diagnosis Date Noted  . Uncontrolled diabetes mellitus (HCC) 10/31/2015  . Chronic pain 10/31/2015  . Rectal pain 11/24/2012  . Hemorrhoids 11/24/2012    Past Surgical History:  Procedure Laterality Date  . COLONOSCOPY  April 2012   Dr. Eliseo Squires sigmoid diverticula  . KNEE ARTHROCENTESIS     both knees  . ROTATOR CUFF REPAIR W/ DISTAL CLAVICLE EXCISION  2014   right side       Home Medications    Prior to Admission medications   Medication Sig Start Date End Date Taking? Authorizing Provider  morphine (MSIR) 15 MG tablet Take 15 mg by mouth every 12 (twelve) hours.   Yes [provider]  cyclobenzaprine (FLEXERIL) 10 MG tablet Take 1 tablet (10 mg total) by mouth 2 (two) times daily as needed for muscle spasms. 10/15/16   Jacalyn Lefevre, MD  DULoxetine (CYMBALTA) 60 MG capsule Take 60 mg by mouth daily.    [provider]  gabapentin (NEURONTIN) 400 MG capsule Take 400 mg by mouth 3 (three) times daily.    [provider]  glipiZIDE (GLUCOTROL) 5 MG  tablet Take 5 mg by mouth daily before breakfast.    [provider]  ketorolac (TORADOL) 10 MG tablet Take 1 tablet (10 mg total) by mouth every 6 (six) hours as needed. 10/15/16   Jacalyn Lefevre, MD  lisinopril-hydrochlorothiazide (PRINZIDE,ZESTORETIC) 10-12.5 MG per tablet Take 1 tablet by mouth daily.    [provider]  metFORMIN (GLUCOPHAGE) 500 MG tablet Take 1,000 mg by mouth 2 (two) times daily with a meal.     [provider]  oxyCODONE-acetaminophen (PERCOCET) 10-325 MG tablet Take 1 tablet by mouth every 6 (six) hours as needed for pain.    [provider]  tiZANidine (ZANAFLEX) 4 MG tablet Take 4 mg by mouth at bedtime.    [provider]    Family History Family History  Problem Relation Age of Onset  . Colon cancer Paternal Grandmother   . Seizures Neg Hx   . Stroke Neg Hx   . Migraines Neg Hx   . Dementia Neg Hx     Social History Social History  Substance Use Topics  . Smoking status: Never Smoker  . Smokeless tobacco: Never Used  . Alcohol use Yes     Comment: socially     Allergies  Penicillins   Review of Systems Review of Systems  Musculoskeletal: Positive for neck pain and neck stiffness.  All other systems reviewed and are negative.    Physical Exam Updated Vital Signs BP 129/81 (BP Location: Left Arm)   Pulse 64   Temp 98.9 F (37.2 C) (Oral)   Resp 18   Ht 6' (1.829 m)   Wt 234 lb (106.1 kg)   SpO2 98%   BMI 31.74 kg/m   Physical Exam  Constitutional: He is oriented to person, place, and time. He appears well-developed and well-nourished.  HENT:  Head: Normocephalic and atraumatic.  Right Ear: External ear normal.  Left Ear: External ear normal.  Nose: Nose normal.  Mouth/Throat: Oropharynx is clear and moist.  Eyes: Conjunctivae and EOM are normal. Pupils are equal, round, and reactive to light.  Neck: Muscular tenderness present.  Cardiovascular: Normal rate, regular rhythm, normal  heart sounds and intact distal pulses.   Pulmonary/Chest: Effort normal and breath sounds normal.  Abdominal: Soft. Bowel sounds are normal.  Musculoskeletal: Normal range of motion.  Neurological: He is alert and oriented to person, place, and time.  Skin: Skin is warm.  Psychiatric: He has a normal mood and affect. His behavior is normal. Judgment and thought content normal.  Nursing note and vitals reviewed.    ED Treatments / Results  Labs (all labs ordered are listed, but only abnormal results are displayed) Labs Reviewed - No data to display  EKG  EKG Interpretation None       Radiology Dg Cervical Spine Complete  Result Date: 10/15/2016 CLINICAL DATA:  Posterior neck pain, left arm numbness EXAM: CERVICAL SPINE - COMPLETE 4+ VIEW COMPARISON:  CT 10/13/2015 FINDINGS: Normal alignment. No fracture. Mild degenerative facet disease bilaterally. Early anterior spurring in the lower cervical spine. Prevertebral soft tissues are normal. IMPRESSION: No acute bony abnormality. Electronically Signed   By: Charlett NoseKevin  Dover M.D.   On: 10/15/2016 12:49    Procedures Procedures (including critical care time)  Medications Ordered in ED Medications  ketorolac (TORADOL) 30 MG/ML injection 30 mg (30 mg Intramuscular Given 10/15/16 1231)     Initial Impression / Assessment and Plan / ED Course  I have reviewed the triage vital signs and the nursing notes.  Pertinent labs & imaging results that were available during my care of the patient were reviewed by me and considered in my medical decision making (see chart for details).    Sx are likely muscular.  Pt does not want narcotics or anything that will interfere with his pain contract, so he will be given toradol here and a rx for toradol and for flexeril.  He knows to return if worse and to f/u with pcp.  Final Clinical Impressions(s) / ED Diagnoses   Final diagnoses:  Acute strain of neck muscle, initial encounter    New  Prescriptions New Prescriptions   CYCLOBENZAPRINE (FLEXERIL) 10 MG TABLET    Take 1 tablet (10 mg total) by mouth 2 (two) times daily as needed for muscle spasms.   KETOROLAC (TORADOL) 10 MG TABLET    Take 1 tablet (10 mg total) by mouth every 6 (six) hours as needed.     Jacalyn LefevreHaviland, Doye Montilla, MD 10/15/16 1313

## 2016-10-18 DIAGNOSIS — E1165 Type 2 diabetes mellitus with hyperglycemia: Secondary | ICD-10-CM | POA: Diagnosis not present

## 2016-10-18 DIAGNOSIS — E291 Testicular hypofunction: Secondary | ICD-10-CM | POA: Diagnosis not present

## 2016-10-18 DIAGNOSIS — G894 Chronic pain syndrome: Secondary | ICD-10-CM | POA: Diagnosis not present

## 2016-10-26 DIAGNOSIS — G8929 Other chronic pain: Secondary | ICD-10-CM | POA: Diagnosis not present

## 2016-10-26 DIAGNOSIS — Z79891 Long term (current) use of opiate analgesic: Secondary | ICD-10-CM | POA: Diagnosis not present

## 2016-10-26 DIAGNOSIS — M47816 Spondylosis without myelopathy or radiculopathy, lumbar region: Secondary | ICD-10-CM | POA: Diagnosis not present

## 2016-10-27 DIAGNOSIS — Z79891 Long term (current) use of opiate analgesic: Secondary | ICD-10-CM | POA: Diagnosis not present

## 2016-12-22 DIAGNOSIS — M170 Bilateral primary osteoarthritis of knee: Secondary | ICD-10-CM | POA: Diagnosis not present

## 2016-12-22 DIAGNOSIS — M4302 Spondylolysis, cervical region: Secondary | ICD-10-CM | POA: Diagnosis not present

## 2016-12-22 DIAGNOSIS — Z79899 Other long term (current) drug therapy: Secondary | ICD-10-CM | POA: Diagnosis not present

## 2016-12-22 DIAGNOSIS — M1990 Unspecified osteoarthritis, unspecified site: Secondary | ICD-10-CM | POA: Diagnosis not present

## 2017-01-03 DIAGNOSIS — G2581 Restless legs syndrome: Secondary | ICD-10-CM | POA: Insufficient documentation

## 2017-01-03 DIAGNOSIS — M5412 Radiculopathy, cervical region: Secondary | ICD-10-CM | POA: Insufficient documentation

## 2017-01-03 DIAGNOSIS — M353 Polymyalgia rheumatica: Secondary | ICD-10-CM | POA: Insufficient documentation

## 2017-01-03 DIAGNOSIS — IMO0001 Reserved for inherently not codable concepts without codable children: Secondary | ICD-10-CM | POA: Insufficient documentation

## 2017-01-03 DIAGNOSIS — G894 Chronic pain syndrome: Secondary | ICD-10-CM | POA: Diagnosis not present

## 2017-01-03 DIAGNOSIS — M2550 Pain in unspecified joint: Secondary | ICD-10-CM | POA: Diagnosis not present

## 2017-01-07 DIAGNOSIS — M47812 Spondylosis without myelopathy or radiculopathy, cervical region: Secondary | ICD-10-CM | POA: Diagnosis not present

## 2017-01-07 DIAGNOSIS — M4802 Spinal stenosis, cervical region: Secondary | ICD-10-CM | POA: Diagnosis not present

## 2017-01-07 DIAGNOSIS — M5412 Radiculopathy, cervical region: Secondary | ICD-10-CM | POA: Diagnosis not present

## 2017-01-13 DIAGNOSIS — M5412 Radiculopathy, cervical region: Secondary | ICD-10-CM | POA: Diagnosis not present

## 2017-01-13 DIAGNOSIS — R200 Anesthesia of skin: Secondary | ICD-10-CM | POA: Diagnosis not present

## 2017-01-20 DIAGNOSIS — M4302 Spondylolysis, cervical region: Secondary | ICD-10-CM | POA: Diagnosis not present

## 2017-01-20 DIAGNOSIS — M5416 Radiculopathy, lumbar region: Secondary | ICD-10-CM | POA: Diagnosis not present

## 2017-01-20 DIAGNOSIS — Z79899 Other long term (current) drug therapy: Secondary | ICD-10-CM | POA: Diagnosis not present

## 2017-02-16 DIAGNOSIS — K2950 Unspecified chronic gastritis without bleeding: Secondary | ICD-10-CM | POA: Diagnosis not present

## 2017-02-21 DIAGNOSIS — M353 Polymyalgia rheumatica: Secondary | ICD-10-CM | POA: Diagnosis not present

## 2017-02-21 DIAGNOSIS — M150 Primary generalized (osteo)arthritis: Secondary | ICD-10-CM | POA: Diagnosis not present

## 2017-02-21 DIAGNOSIS — M797 Fibromyalgia: Secondary | ICD-10-CM | POA: Diagnosis not present

## 2017-02-22 DIAGNOSIS — Z79899 Other long term (current) drug therapy: Secondary | ICD-10-CM | POA: Diagnosis not present

## 2017-02-22 DIAGNOSIS — M549 Dorsalgia, unspecified: Secondary | ICD-10-CM | POA: Diagnosis not present

## 2017-02-22 DIAGNOSIS — M2550 Pain in unspecified joint: Secondary | ICD-10-CM | POA: Diagnosis not present

## 2017-02-22 DIAGNOSIS — G8929 Other chronic pain: Secondary | ICD-10-CM | POA: Diagnosis not present

## 2017-03-01 DIAGNOSIS — G5603 Carpal tunnel syndrome, bilateral upper limbs: Secondary | ICD-10-CM | POA: Diagnosis not present

## 2017-03-01 DIAGNOSIS — M5412 Radiculopathy, cervical region: Secondary | ICD-10-CM | POA: Diagnosis not present

## 2017-03-15 IMAGING — DX DG ANKLE COMPLETE 3+V*L*
3 series · 3 of 3 positions shown · non-contrast
Comparison: None.

CLINICAL DATA: Rolled ankle at work today, lateral pain and
swelling

EXAM:
LEFT ANKLE COMPLETE - 3+ VIEW

[ankle ap]
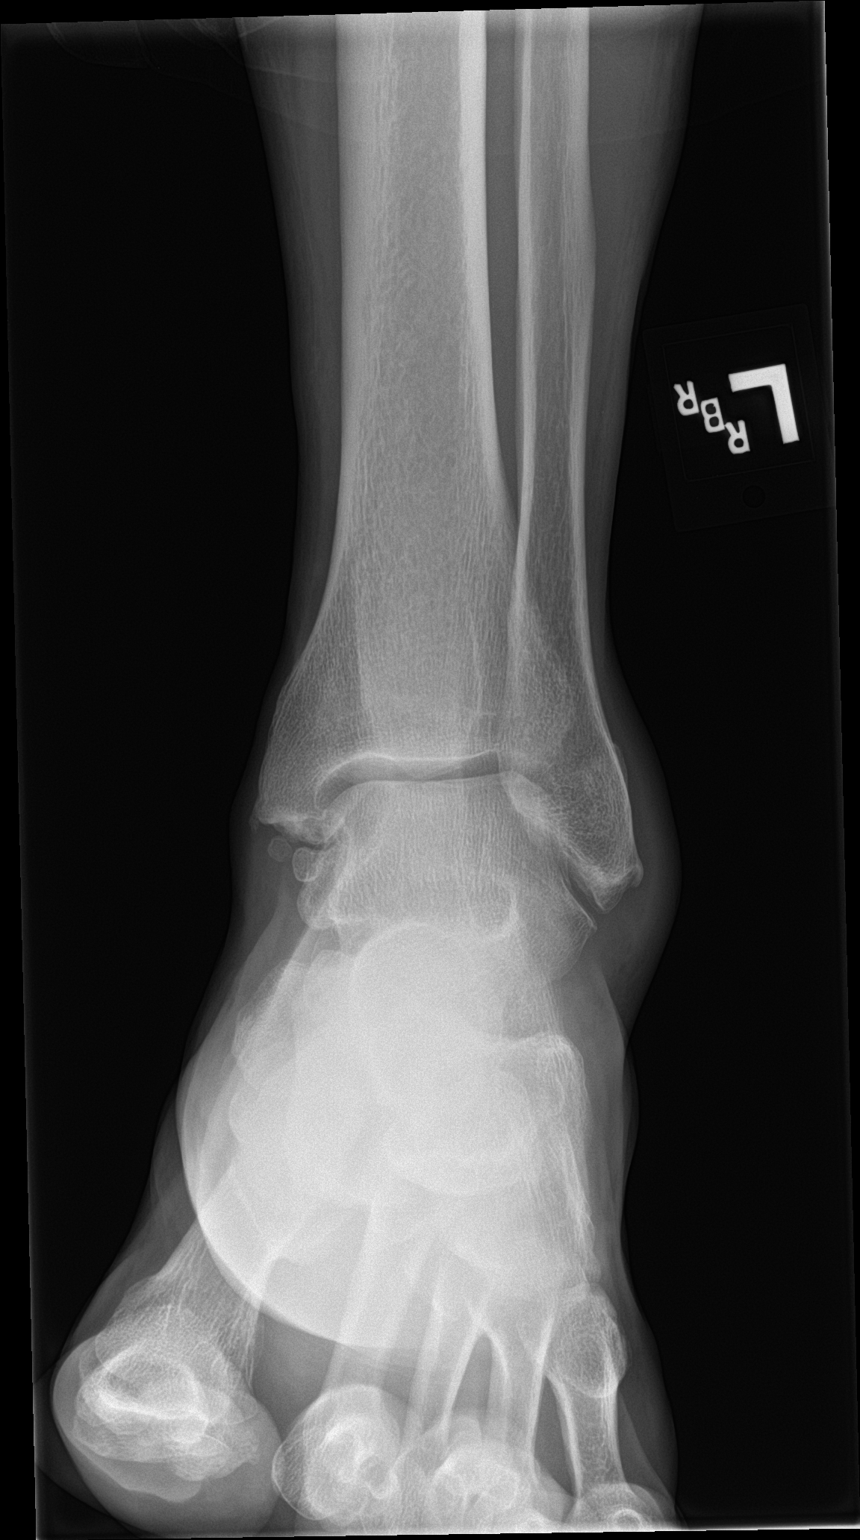

[ankle obl]
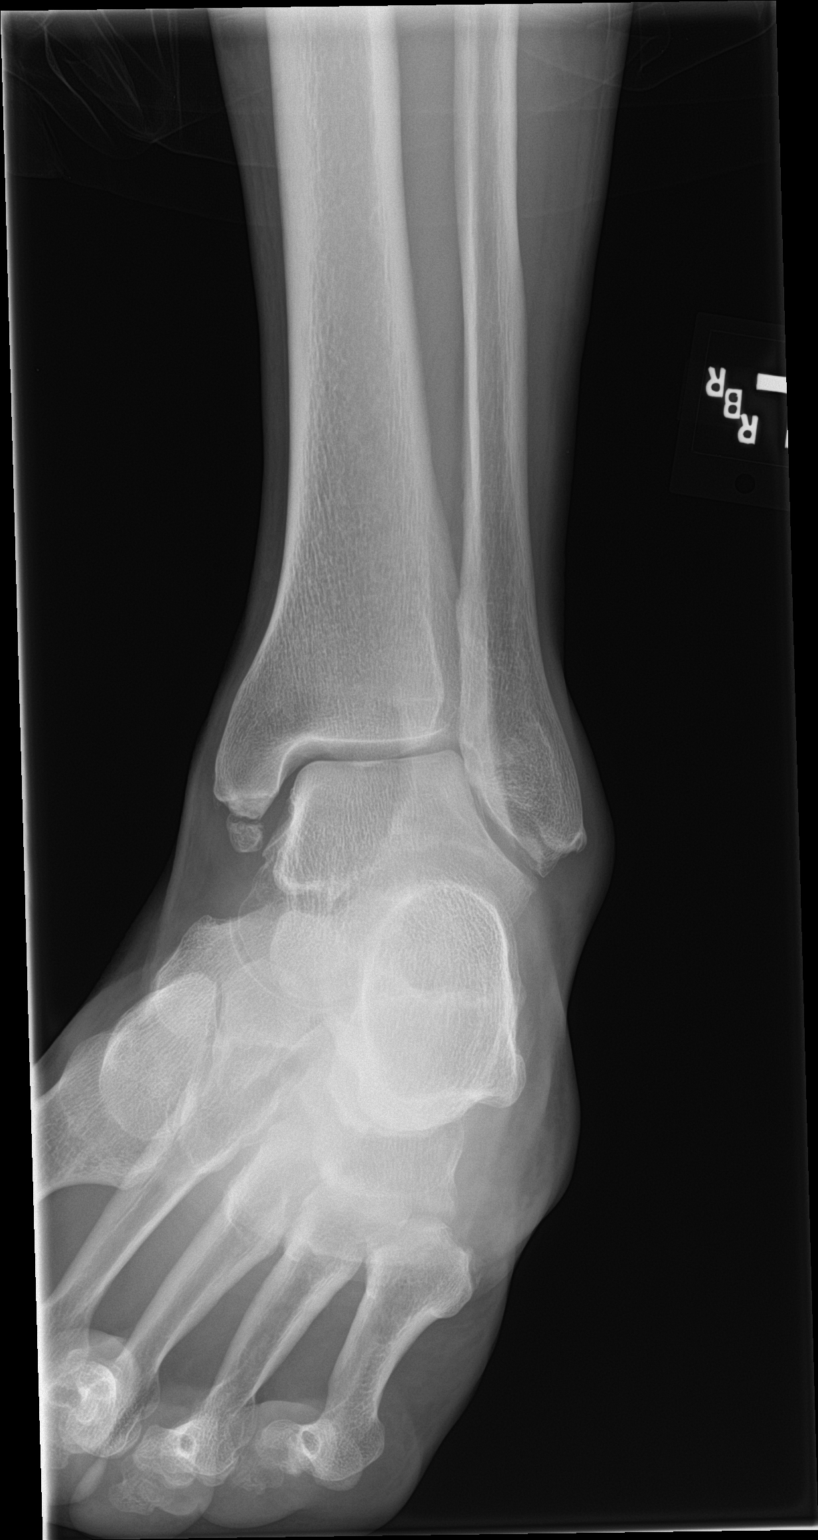

[ankle lat]
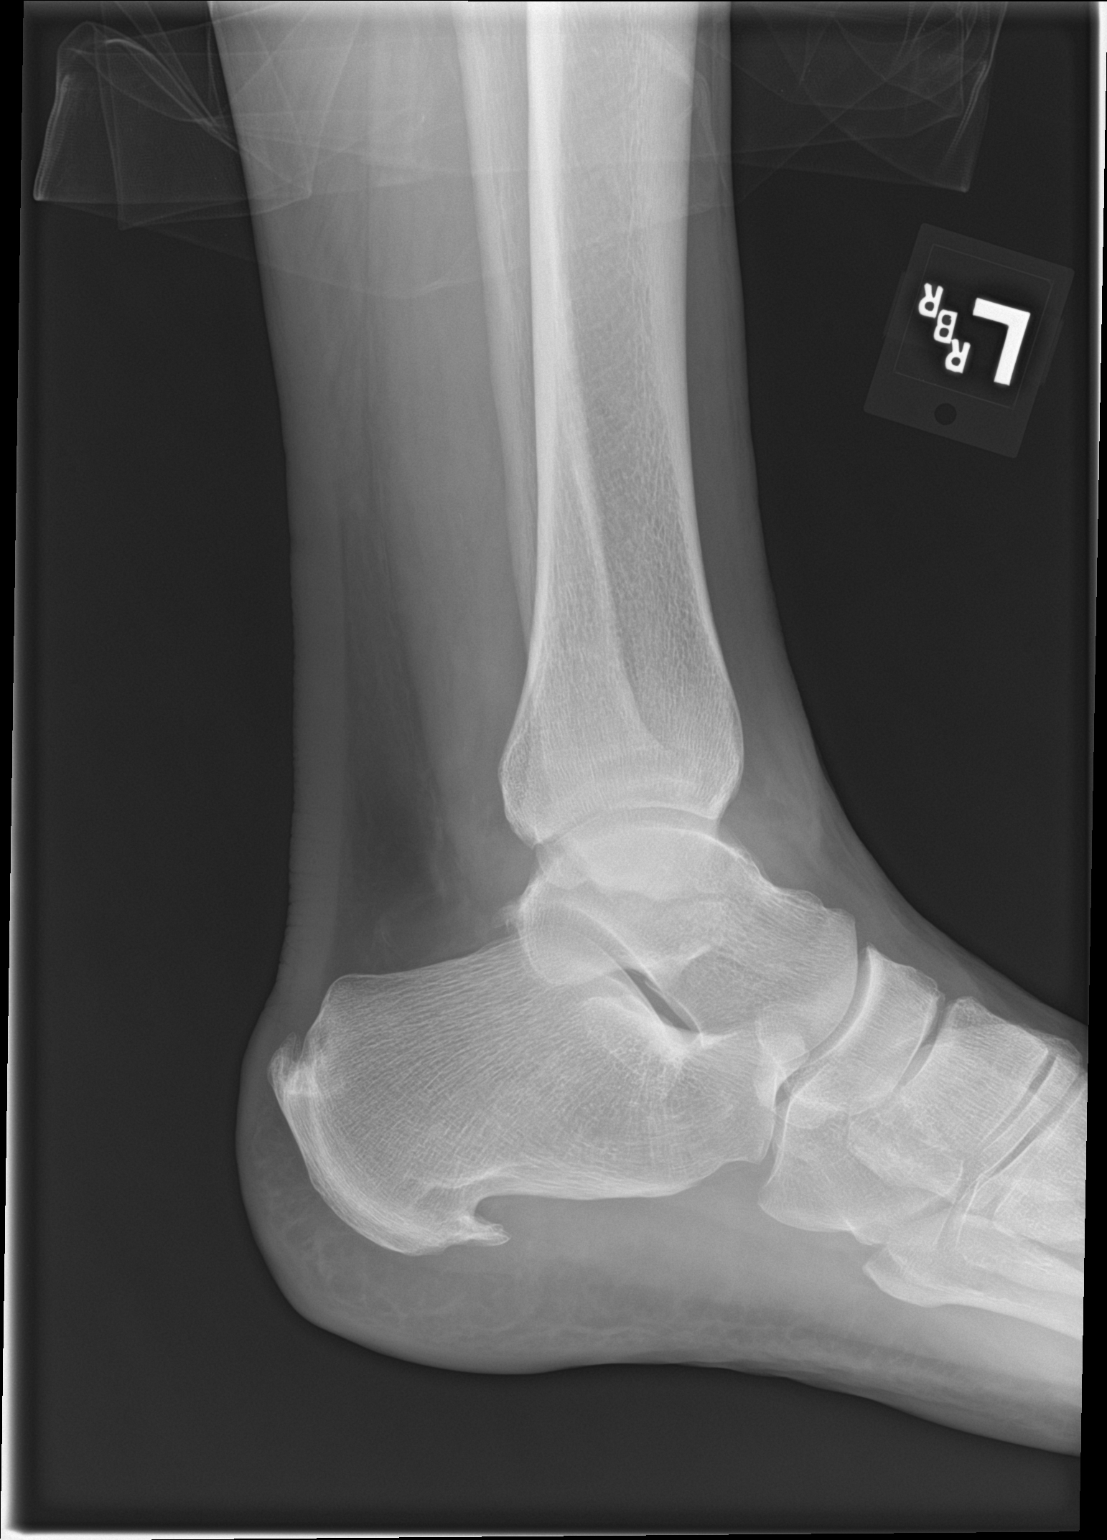

[3 of 3 positions shown; findings below may reference images not displayed]

FINDINGS: Three views of the left ankle submitted. No acute fracture or
subluxation. Ankle mortise is preserved. Well corticated bony
fragments adjacent to distal tibia medial malleolus most likely from
prior injury. There is mild spurring of distal fibula. Soft tissue
swelling adjacent to lateral malleolus.
IMPRESSION: No acute fracture or subluxation. Ankle mortise is preserved.
Lateral soft tissue swelling. Well corticated small bony fragments
adjacent to tip of distal tibia most likely from prior injury.

## 2017-03-22 DIAGNOSIS — M542 Cervicalgia: Secondary | ICD-10-CM | POA: Diagnosis not present

## 2017-03-22 DIAGNOSIS — Z79899 Other long term (current) drug therapy: Secondary | ICD-10-CM | POA: Diagnosis not present

## 2017-04-15 DIAGNOSIS — I10 Essential (primary) hypertension: Secondary | ICD-10-CM | POA: Diagnosis not present

## 2017-04-15 DIAGNOSIS — E291 Testicular hypofunction: Secondary | ICD-10-CM | POA: Diagnosis not present

## 2017-04-15 DIAGNOSIS — E1165 Type 2 diabetes mellitus with hyperglycemia: Secondary | ICD-10-CM | POA: Diagnosis not present

## 2017-04-20 DIAGNOSIS — M4302 Spondylolysis, cervical region: Secondary | ICD-10-CM | POA: Diagnosis not present

## 2017-04-20 DIAGNOSIS — K5903 Drug induced constipation: Secondary | ICD-10-CM | POA: Diagnosis not present

## 2017-04-20 DIAGNOSIS — Z79899 Other long term (current) drug therapy: Secondary | ICD-10-CM | POA: Diagnosis not present

## 2017-04-20 DIAGNOSIS — T402X5A Adverse effect of other opioids, initial encounter: Secondary | ICD-10-CM | POA: Diagnosis not present

## 2017-05-03 DIAGNOSIS — R350 Frequency of micturition: Secondary | ICD-10-CM | POA: Diagnosis not present

## 2017-05-03 DIAGNOSIS — E1165 Type 2 diabetes mellitus with hyperglycemia: Secondary | ICD-10-CM | POA: Diagnosis not present

## 2017-05-03 DIAGNOSIS — R300 Dysuria: Secondary | ICD-10-CM | POA: Diagnosis not present

## 2017-05-03 DIAGNOSIS — G894 Chronic pain syndrome: Secondary | ICD-10-CM | POA: Diagnosis not present

## 2017-05-03 DIAGNOSIS — E291 Testicular hypofunction: Secondary | ICD-10-CM | POA: Diagnosis not present

## 2017-05-24 DIAGNOSIS — Z79899 Other long term (current) drug therapy: Secondary | ICD-10-CM | POA: Diagnosis not present

## 2017-05-24 DIAGNOSIS — G8929 Other chronic pain: Secondary | ICD-10-CM | POA: Diagnosis not present

## 2017-05-24 DIAGNOSIS — M2550 Pain in unspecified joint: Secondary | ICD-10-CM | POA: Diagnosis not present
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
## 2017-06-23 DIAGNOSIS — Z79899 Other long term (current) drug therapy: Secondary | ICD-10-CM | POA: Diagnosis not present

## 2017-06-23 DIAGNOSIS — M542 Cervicalgia: Secondary | ICD-10-CM | POA: Diagnosis not present

## 2017-06-27 DIAGNOSIS — X32XXXD Exposure to sunlight, subsequent encounter: Secondary | ICD-10-CM | POA: Diagnosis not present

## 2017-06-27 DIAGNOSIS — L820 Inflamed seborrheic keratosis: Secondary | ICD-10-CM | POA: Diagnosis not present

## 2017-06-27 DIAGNOSIS — L570 Actinic keratosis: Secondary | ICD-10-CM | POA: Diagnosis not present

## 2017-07-06 DIAGNOSIS — M542 Cervicalgia: Secondary | ICD-10-CM | POA: Diagnosis not present

## 2017-07-06 DIAGNOSIS — G8929 Other chronic pain: Secondary | ICD-10-CM | POA: Diagnosis not present

## 2017-07-06 DIAGNOSIS — M545 Low back pain: Secondary | ICD-10-CM | POA: Diagnosis not present

## 2017-07-22 DIAGNOSIS — G8929 Other chronic pain: Secondary | ICD-10-CM | POA: Diagnosis not present

## 2017-07-22 DIAGNOSIS — M542 Cervicalgia: Secondary | ICD-10-CM | POA: Diagnosis not present

## 2017-07-22 DIAGNOSIS — M545 Low back pain: Secondary | ICD-10-CM | POA: Diagnosis not present

## 2017-07-22 DIAGNOSIS — Z79899 Other long term (current) drug therapy: Secondary | ICD-10-CM | POA: Diagnosis not present

## 2017-08-05 DIAGNOSIS — M545 Low back pain: Secondary | ICD-10-CM | POA: Diagnosis not present

## 2017-08-05 DIAGNOSIS — G8929 Other chronic pain: Secondary | ICD-10-CM | POA: Diagnosis not present

## 2017-08-05 DIAGNOSIS — M542 Cervicalgia: Secondary | ICD-10-CM | POA: Diagnosis not present

## 2017-08-19 DIAGNOSIS — M542 Cervicalgia: Secondary | ICD-10-CM | POA: Diagnosis not present

## 2017-08-19 DIAGNOSIS — Z79899 Other long term (current) drug therapy: Secondary | ICD-10-CM | POA: Diagnosis not present

## 2017-08-30 DIAGNOSIS — E119 Type 2 diabetes mellitus without complications: Secondary | ICD-10-CM | POA: Diagnosis not present

## 2017-08-30 DIAGNOSIS — M2041 Other hammer toe(s) (acquired), right foot: Secondary | ICD-10-CM | POA: Diagnosis not present

## 2017-08-30 DIAGNOSIS — B351 Tinea unguium: Secondary | ICD-10-CM | POA: Diagnosis not present

## 2017-09-19 DIAGNOSIS — M542 Cervicalgia: Secondary | ICD-10-CM | POA: Diagnosis not present

## 2017-09-19 DIAGNOSIS — Z79899 Other long term (current) drug therapy: Secondary | ICD-10-CM | POA: Diagnosis not present

## 2017-09-19 DIAGNOSIS — M1990 Unspecified osteoarthritis, unspecified site: Secondary | ICD-10-CM | POA: Diagnosis not present

## 2017-09-20 DIAGNOSIS — L84 Corns and callosities: Secondary | ICD-10-CM | POA: Insufficient documentation

## 2017-09-20 DIAGNOSIS — M2041 Other hammer toe(s) (acquired), right foot: Secondary | ICD-10-CM | POA: Insufficient documentation

## 2017-09-20 DIAGNOSIS — M79674 Pain in right toe(s): Secondary | ICD-10-CM | POA: Diagnosis not present

## 2017-09-27 DIAGNOSIS — E291 Testicular hypofunction: Secondary | ICD-10-CM | POA: Diagnosis not present

## 2017-09-27 DIAGNOSIS — E1165 Type 2 diabetes mellitus with hyperglycemia: Secondary | ICD-10-CM | POA: Diagnosis not present

## 2017-09-27 DIAGNOSIS — I10 Essential (primary) hypertension: Secondary | ICD-10-CM | POA: Diagnosis not present

## 2017-09-27 DIAGNOSIS — G894 Chronic pain syndrome: Secondary | ICD-10-CM | POA: Diagnosis not present

## 2017-10-05 ENCOUNTER — Ambulatory Visit: Payer: Self-pay | Admitting: Urology

## 2017-10-19 DIAGNOSIS — M1990 Unspecified osteoarthritis, unspecified site: Secondary | ICD-10-CM | POA: Diagnosis not present

## 2017-10-19 DIAGNOSIS — M542 Cervicalgia: Secondary | ICD-10-CM | POA: Diagnosis not present

## 2017-10-19 DIAGNOSIS — Z79899 Other long term (current) drug therapy: Secondary | ICD-10-CM | POA: Diagnosis not present

## 2017-11-17 DIAGNOSIS — M1990 Unspecified osteoarthritis, unspecified site: Secondary | ICD-10-CM | POA: Diagnosis not present

## 2017-11-17 DIAGNOSIS — M542 Cervicalgia: Secondary | ICD-10-CM | POA: Diagnosis not present

## 2017-11-17 DIAGNOSIS — Z79899 Other long term (current) drug therapy: Secondary | ICD-10-CM | POA: Diagnosis not present

## 2017-12-15 DIAGNOSIS — G8929 Other chronic pain: Secondary | ICD-10-CM | POA: Diagnosis not present

## 2017-12-15 DIAGNOSIS — Z79899 Other long term (current) drug therapy: Secondary | ICD-10-CM | POA: Diagnosis not present

## 2017-12-15 DIAGNOSIS — M2550 Pain in unspecified joint: Secondary | ICD-10-CM | POA: Diagnosis not present

## 2017-12-15 DIAGNOSIS — G894 Chronic pain syndrome: Secondary | ICD-10-CM | POA: Diagnosis not present

## 2018-01-19 DIAGNOSIS — G894 Chronic pain syndrome: Secondary | ICD-10-CM | POA: Diagnosis not present

## 2018-01-19 DIAGNOSIS — M2550 Pain in unspecified joint: Secondary | ICD-10-CM | POA: Diagnosis not present

## 2018-01-19 DIAGNOSIS — Z79899 Other long term (current) drug therapy: Secondary | ICD-10-CM | POA: Diagnosis not present

## 2018-02-20 DIAGNOSIS — M2550 Pain in unspecified joint: Secondary | ICD-10-CM | POA: Diagnosis not present

## 2018-02-20 DIAGNOSIS — Z79899 Other long term (current) drug therapy: Secondary | ICD-10-CM | POA: Diagnosis not present

## 2018-02-20 DIAGNOSIS — G894 Chronic pain syndrome: Secondary | ICD-10-CM | POA: Diagnosis not present

## 2018-02-20 DIAGNOSIS — G8929 Other chronic pain: Secondary | ICD-10-CM | POA: Diagnosis not present

## 2018-03-10 DIAGNOSIS — B351 Tinea unguium: Secondary | ICD-10-CM | POA: Diagnosis not present

## 2018-03-10 DIAGNOSIS — L600 Ingrowing nail: Secondary | ICD-10-CM | POA: Diagnosis not present

## 2018-03-10 DIAGNOSIS — E119 Type 2 diabetes mellitus without complications: Secondary | ICD-10-CM | POA: Diagnosis not present

## 2018-03-20 DIAGNOSIS — G894 Chronic pain syndrome: Secondary | ICD-10-CM | POA: Diagnosis not present

## 2018-03-20 DIAGNOSIS — G8929 Other chronic pain: Secondary | ICD-10-CM | POA: Diagnosis not present

## 2018-03-20 DIAGNOSIS — M2550 Pain in unspecified joint: Secondary | ICD-10-CM | POA: Diagnosis not present

## 2018-03-20 DIAGNOSIS — Z79899 Other long term (current) drug therapy: Secondary | ICD-10-CM | POA: Diagnosis not present

## 2018-04-04 ENCOUNTER — Encounter (HOSPITAL_BASED_OUTPATIENT_CLINIC_OR_DEPARTMENT_OTHER): Payer: Self-pay | Admitting: *Deleted

## 2018-04-04 ENCOUNTER — Emergency Department (HOSPITAL_BASED_OUTPATIENT_CLINIC_OR_DEPARTMENT_OTHER): Payer: Worker's Compensation

## 2018-04-04 ENCOUNTER — Other Ambulatory Visit: Payer: Self-pay

## 2018-04-04 ENCOUNTER — Emergency Department (HOSPITAL_BASED_OUTPATIENT_CLINIC_OR_DEPARTMENT_OTHER)
Admission: EM | Admit: 2018-04-04 | Discharge: 2018-04-04 | Disposition: A | Discharge status: Home / Self Care | Payer: Worker's Compensation | Attending: Emergency Medicine | Admitting: Emergency Medicine

## 2018-04-04 DIAGNOSIS — I10 Essential (primary) hypertension: Secondary | ICD-10-CM | POA: Diagnosis not present

## 2018-04-04 DIAGNOSIS — Z79899 Other long term (current) drug therapy: Secondary | ICD-10-CM | POA: Diagnosis not present

## 2018-04-04 DIAGNOSIS — Z7984 Long term (current) use of oral hypoglycemic drugs: Secondary | ICD-10-CM | POA: Insufficient documentation

## 2018-04-04 DIAGNOSIS — E119 Type 2 diabetes mellitus without complications: Secondary | ICD-10-CM | POA: Diagnosis not present

## 2018-04-04 DIAGNOSIS — M25561 Pain in right knee: Secondary | ICD-10-CM | POA: Diagnosis not present

## 2018-04-04 NOTE — Discharge Instructions (Signed)
Take 4 over the counter ibuprofen tablets 3 times a day or 2 over-the-counter naproxen tablets twice a day for pain. Also take tylenol 1000mg(2 extra strength) four times a day.    

## 2018-04-04 NOTE — ED Notes (Signed)
UDS completed 

## 2018-04-04 NOTE — ED Triage Notes (Signed)
C/o inj to rt knee  Turned heard hard pop  inj yesterday at 1030 am  ambulatory

## 2018-04-04 NOTE — ED Notes (Signed)
Waiting xray results.

## 2018-04-04 NOTE — ED Provider Notes (Signed)
[facility] EMERGENCY DEPARTMENT Provider Note   CSN: [id number] Arrival date & time: 04/04/18  0537     History   Chief Complaint Chief Complaint  Patient presents with  . Knee Pain    HPI RUFORD DUDZINSKI is a 52 y.o. male.  The history is provided by the patient.  He has history of hypertension, diabetes, elevated liver enzymes, chronic neck pain and comes in following injuring his right knee at work.  He states that he was twisting getting on and off a machine and felt something pop in his right knee.  Since then, every time he walks, he feels something crunching in his knee.  He has a history of having dislocated that knee in 1997 and had arthroscopic surgery at that time.  He is a chronic pain patient and has been taking his usual chronic pain medications.  In spite of that, pain continues to be 7/10.  He denies other injury.  Past Medical History:  Diagnosis Date  . Chronic neck pain    "bad discs in my neck"  . Disc disorder    buldging  . Diverticulosis   . DM (diabetes mellitus) (HCC)   . Elevated LFTs    "for the past 20 years"  . HTN (hypertension)   . Syncope    recurrent syncope and near syncope "for at least 10 years"    Patient Active Problem List   Diagnosis Date Noted  . Uncontrolled diabetes mellitus (HCC) 10/31/2015  . Chronic pain 10/31/2015  . Rectal pain 11/24/2012  . Hemorrhoids 11/24/2012    Past Surgical History:  Procedure Laterality Date  . COLONOSCOPY  April 2012   Dr. Eliseo Squires sigmoid diverticula  . KNEE ARTHROCENTESIS     both knees  . ROTATOR CUFF REPAIR W/ DISTAL CLAVICLE EXCISION  2014   right side        Home Medications    Prior to Admission medications   Medication Sig Start Date End Date Taking? Authorizing Provider  cyclobenzaprine (FLEXERIL) 10 MG tablet Take 1 tablet (10 mg total) by mouth 2 (two) times daily as needed for muscle spasms. 10/15/16   Jacalyn Lefevre, MD  DULoxetine (CYMBALTA) 60 MG  capsule Take 60 mg by mouth daily.    [provider]  gabapentin (NEURONTIN) 400 MG capsule Take 400 mg by mouth 3 (three) times daily.    [provider]  glipiZIDE (GLUCOTROL) 5 MG tablet Take 5 mg by mouth daily before breakfast.    [provider]  ketorolac (TORADOL) 10 MG tablet Take 1 tablet (10 mg total) by mouth every 6 (six) hours as needed. 10/15/16   Jacalyn Lefevre, MD  lisinopril-hydrochlorothiazide (PRINZIDE,ZESTORETIC) 10-12.5 MG per tablet Take 1 tablet by mouth daily.    [provider]  metFORMIN (GLUCOPHAGE) 500 MG tablet Take 1,000 mg by mouth 2 (two) times daily with a meal.     [provider]  morphine (MSIR) 15 MG tablet Take 15 mg by mouth every 12 (twelve) hours.    [provider]  oxyCODONE-acetaminophen (PERCOCET) 10-325 MG tablet Take 1 tablet by mouth every 6 (six) hours as needed for pain.    [provider]  tiZANidine (ZANAFLEX) 4 MG tablet Take 4 mg by mouth at bedtime.    [provider]    Family History Family History  Problem Relation Age of Onset  . Colon cancer Paternal Grandmother   . Seizures Neg Hx   . Stroke Neg Hx   .  Migraines Neg Hx   . Dementia Neg Hx     Social History Social History   Tobacco Use  . Smoking status: Never Smoker  . Smokeless tobacco: Never Used  Substance Use Topics  . Alcohol use: Yes    Comment: socially  . Drug use: No     Allergies   Penicillins   Review of Systems Review of Systems  All other systems reviewed and are negative.    Physical Exam Updated Vital Signs BP (!) 158/96 (BP Location: Left Arm)   Pulse 85   Temp 98.6 F (37 C) (Oral)   Resp 18   Ht 6' (1.829 m)   Wt 106.3 kg   SpO2 97%   BMI 31.78 kg/m   Physical Exam  Nursing note and vitals reviewed.  52 year old male, resting comfortably and in no acute distress. Vital signs are significant for elevated blood pressure. Oxygen saturation is 97%, which is  normal. Head is normocephalic and atraumatic. PERRLA, EOMI. Oropharynx is clear. Neck is nontender and supple without adenopathy or JVD. Back is nontender and there is no CVA tenderness. Lungs are clear without rales, wheezes, or rhonchi. Chest is nontender. Heart has regular rate and rhythm without murmur. Abdomen is soft, flat, nontender without masses or hepatosplenomegaly and peristalsis is normoactive. Extremities: No swelling or deformity of the right knee.  No tenderness on direct palpation.  No instability.  There does seem to be pain with passive range of motion and he provides significant resistance with attempts to flex and extend the knee.  McMurray's test and Lachman's tests were suboptimal because of this. Skin is warm and dry without rash. Neurologic: Mental status is normal, cranial nerves are intact, there are no motor or sensory deficits.  ED Treatments / Results   Radiology No results found.  Procedures Procedures   Medications Ordered in ED Medications - No data to display   Initial Impression / Assessment and Plan / ED Course  I have reviewed the triage vital signs and the nursing notes.  Pertinent labs & imaging results that were available during my care of the patient were reviewed by me and considered in my medical decision making (see chart for details).  Right knee injury.  Suspect meniscus tear.  He will be sent for knee x-rays.  Old records are reviewed, and he has no relevant past visits.  Case is signed out to Dr. Adela Lank.  Final Clinical Impressions(s) / ED Diagnoses   Final diagnoses:  Acute pain of right knee    ED Discharge Orders    None       Dione Booze, MD 04/04/18 [phone]

## 2018-04-07 ENCOUNTER — Telehealth: Payer: Self-pay | Admitting: Orthopedic Surgery

## 2018-04-07 NOTE — Telephone Encounter (Signed)
Call received from Workers comp adjuster Berenice Bouton, ph [phone], inquiring following Emergency room visit at [facility] facility, [facility]. Notes indicate referral made to provider near that location; however, states patient lives in [place].  Please review regarding problem of knee pain and if okay to schedule.

## 2018-04-10 NOTE — Telephone Encounter (Signed)
Wednesday

## 2018-04-11 NOTE — Telephone Encounter (Signed)
Called patient to offer appointment per Dr Mort Sawyers response. Left voice message at cell number.

## 2018-04-12 DIAGNOSIS — M25561 Pain in right knee: Secondary | ICD-10-CM | POA: Insufficient documentation

## 2018-04-12 NOTE — Telephone Encounter (Signed)
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 Tried patient back; states he was already approved and has seen orthopaedist in [place].

## 2018-04-17 DIAGNOSIS — G894 Chronic pain syndrome: Secondary | ICD-10-CM | POA: Diagnosis not present

## 2018-04-17 DIAGNOSIS — I10 Essential (primary) hypertension: Secondary | ICD-10-CM | POA: Diagnosis not present

## 2018-04-17 DIAGNOSIS — E1165 Type 2 diabetes mellitus with hyperglycemia: Secondary | ICD-10-CM | POA: Diagnosis not present

## 2018-05-19 DIAGNOSIS — M545 Low back pain: Secondary | ICD-10-CM | POA: Diagnosis not present

## 2018-05-19 DIAGNOSIS — Z79899 Other long term (current) drug therapy: Secondary | ICD-10-CM | POA: Diagnosis not present

## 2018-05-19 DIAGNOSIS — G894 Chronic pain syndrome: Secondary | ICD-10-CM | POA: Diagnosis not present

## 2018-05-19 DIAGNOSIS — M2550 Pain in unspecified joint: Secondary | ICD-10-CM | POA: Diagnosis not present

## 2018-07-13 DIAGNOSIS — G894 Chronic pain syndrome: Secondary | ICD-10-CM | POA: Diagnosis not present

## 2018-07-13 DIAGNOSIS — E1165 Type 2 diabetes mellitus with hyperglycemia: Secondary | ICD-10-CM | POA: Diagnosis not present

## 2018-07-13 DIAGNOSIS — I10 Essential (primary) hypertension: Secondary | ICD-10-CM | POA: Diagnosis not present

## 2018-07-17 DIAGNOSIS — M542 Cervicalgia: Secondary | ICD-10-CM | POA: Diagnosis not present

## 2018-07-17 DIAGNOSIS — M1990 Unspecified osteoarthritis, unspecified site: Secondary | ICD-10-CM | POA: Diagnosis not present

## 2018-07-17 DIAGNOSIS — G894 Chronic pain syndrome: Secondary | ICD-10-CM | POA: Diagnosis not present

## 2018-07-17 DIAGNOSIS — Z79899 Other long term (current) drug therapy: Secondary | ICD-10-CM | POA: Diagnosis not present

## 2018-08-22 DIAGNOSIS — K529 Noninfective gastroenteritis and colitis, unspecified: Secondary | ICD-10-CM | POA: Diagnosis not present

## 2018-08-22 DIAGNOSIS — Z6830 Body mass index (BMI) 30.0-30.9, adult: Secondary | ICD-10-CM | POA: Diagnosis not present

## 2018-09-14 DIAGNOSIS — G894 Chronic pain syndrome: Secondary | ICD-10-CM | POA: Diagnosis not present

## 2018-09-14 DIAGNOSIS — Z79899 Other long term (current) drug therapy: Secondary | ICD-10-CM | POA: Diagnosis not present

## 2018-09-14 DIAGNOSIS — M542 Cervicalgia: Secondary | ICD-10-CM | POA: Diagnosis not present

## 2018-09-14 DIAGNOSIS — M1990 Unspecified osteoarthritis, unspecified site: Secondary | ICD-10-CM | POA: Diagnosis not present

## 2018-11-01 DIAGNOSIS — I10 Essential (primary) hypertension: Secondary | ICD-10-CM | POA: Diagnosis not present

## 2018-11-01 DIAGNOSIS — G894 Chronic pain syndrome: Secondary | ICD-10-CM | POA: Diagnosis not present

## 2018-11-01 DIAGNOSIS — E1165 Type 2 diabetes mellitus with hyperglycemia: Secondary | ICD-10-CM | POA: Diagnosis not present

## 2018-12-04 ENCOUNTER — Other Ambulatory Visit: Payer: Self-pay | Admitting: Internal Medicine

## 2018-12-04 ENCOUNTER — Other Ambulatory Visit: Payer: Self-pay

## 2018-12-04 DIAGNOSIS — Z20822 Contact with and (suspected) exposure to covid-19: Secondary | ICD-10-CM

## 2018-12-07 LAB — NOVEL CORONAVIRUS, NAA: SARS-CoV-2, NAA: NOT DETECTED

## 2019-04-12 ENCOUNTER — Other Ambulatory Visit: Payer: Self-pay

## 2019-04-12 DIAGNOSIS — Z20822 Contact with and (suspected) exposure to covid-19: Secondary | ICD-10-CM

## 2019-04-13 LAB — NOVEL CORONAVIRUS, NAA: SARS-CoV-2, NAA: NOT DETECTED

## 2019-04-20 ENCOUNTER — Other Ambulatory Visit: Payer: Self-pay

## 2019-04-20 DIAGNOSIS — Z20822 Contact with and (suspected) exposure to covid-19: Secondary | ICD-10-CM

## 2019-04-23 LAB — NOVEL CORONAVIRUS, NAA: SARS-CoV-2, NAA: NOT DETECTED

## 2020-10-03 ENCOUNTER — Ambulatory Visit: Payer: 59 | Admitting: Podiatry

## 2020-10-09 ENCOUNTER — Other Ambulatory Visit: Payer: Self-pay

## 2020-10-09 ENCOUNTER — Encounter: Payer: Self-pay | Admitting: Podiatry

## 2020-10-09 ENCOUNTER — Ambulatory Visit: Payer: BC Managed Care – PPO | Admitting: Podiatry

## 2020-10-09 ENCOUNTER — Ambulatory Visit (INDEPENDENT_AMBULATORY_CARE_PROVIDER_SITE_OTHER): Payer: BC Managed Care – PPO

## 2020-10-09 DIAGNOSIS — M79672 Pain in left foot: Secondary | ICD-10-CM

## 2020-10-09 DIAGNOSIS — M79671 Pain in right foot: Secondary | ICD-10-CM | POA: Diagnosis not present

## 2020-10-09 DIAGNOSIS — M2041 Other hammer toe(s) (acquired), right foot: Secondary | ICD-10-CM | POA: Diagnosis not present

## 2020-10-11 NOTE — Progress Notes (Signed)
Subjective:   Patient ID: Phillip Hodges, male   DOB: 55 y.o.   MRN: [id number]   HPI Patient presents with chronic very painful digital deformity digit to partial third digit right foot that he cannot wear shoe gear with due to the intensity of the keratotic lesion formation.  He knows he needs surgery on this and is hoping that this can be done and has been working on his sugar and has A1c below 8.  Patient states that he is worried he will develop an infection someday and he wants this corrected if possible.  Patient does wear steel toe shoes at work.  Patient does not smoke likes to be active   Review of Systems  All other systems reviewed and are negative.       Objective:  Physical Exam Vitals and nursing note reviewed.  Constitutional:      Appearance: He is well-developed.  Pulmonary:     Effort: Pulmonary effort is normal.  Musculoskeletal:        General: Normal range of motion.  Skin:    General: Skin is warm.  Neurological:     Mental Status: He is alert.     Neurovascular status found to be intact muscle strength was found to be adequate range of motion adequate.  Patient is noted to have significant keratotic lesion distal aspect digit to mild on the third right foot with elongated toes hammertoe deformity and pressure occurring secondary to the structural alignment of the digits right over left foot.  Patient has good digital perfusion well oriented x3     Assessment:  Hammertoe deformity digits 2 3 right foot with elongation of the toe chronic keratotic lesion diabetes under good control with excellent circulatory status     Plan:  8 NP reviewed condition at great length.  I do think digital fusion is necessary and I recommended fusion digits 2 and 3 right foot explaining this to him and this is what he wants done.  He understands risk want surgery and needs to do it soon and wants to do consent form today as he is already been the other physicians who said he  needed the same thing.  I explained procedure risk I allowed him to read consent form going over alternative treatments complications and patient is scheduled for outpatient surgery and is encouraged to call with any further questions which may come up and will have digital fusion with pin fixation digits 2 3 right foot.  Understands total recovery for this can take about 6 months and will most likely be out of work between 6 and 12 weeks  X-rays indicate elongated digits digits 2 3 bilateral with distal can pression of the second digit right foot

## 2020-10-15 ENCOUNTER — Telehealth: Payer: Self-pay | Admitting: Urology

## 2020-10-15 NOTE — Telephone Encounter (Signed)
DOS - 10/28/20   HAMMERTOE REPAIR 2,3 RIGHT --- [id number]   BCBS EFFECTIVE DATE - 06/08/19   PLAN DEDUCTIBLE - $1,500.00 W/ $1,500.00 REMAINING OUT OF POCKET - $5,500.00 W/ $5,186.78 REMAINING COINSURANCE - 20% COPAY - $0.00  PER [employer] AUTOMATIVE SYSTEM FOR CPT CODES 56812 X'S 2 NO PRIOR AUTH IS REQUIRED.   REF #[id number]

## 2020-10-22 ENCOUNTER — Telehealth: Payer: Self-pay

## 2020-10-22 NOTE — Telephone Encounter (Signed)
Cato called to reschedule his surgery with Dr. Charlsie Merles on 10/28/2020. 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 I have him rescheduled to 11/04/2020. Notified Dr. Charlsie Merles and Aram Beecham with [facility]

## 2020-10-30 DIAGNOSIS — M79676 Pain in unspecified toe(s): Secondary | ICD-10-CM

## 2020-10-31 MED ORDER — HYDROCODONE-ACETAMINOPHEN 10-325 MG PO TABS: 1.0000 | ORAL_TABLET | Freq: Three times a day (TID) | ORAL | 0 refills | Status: AC | PRN

## 2020-10-31 NOTE — Addendum Note (Signed)
Addended by: Lenn Sink on: 10/31/2020 12:45 PM   Modules accepted: Orders

## 2020-11-04 ENCOUNTER — Ambulatory Visit: Payer: BC Managed Care – PPO | Admitting: Nurse Practitioner

## 2020-11-04 ENCOUNTER — Encounter: Payer: Self-pay | Admitting: Podiatry

## 2020-11-04 DIAGNOSIS — M2041 Other hammer toe(s) (acquired), right foot: Secondary | ICD-10-CM | POA: Diagnosis not present

## 2020-11-05 ENCOUNTER — Encounter: Payer: BC Managed Care – PPO | Admitting: Podiatry

## 2020-11-07 ENCOUNTER — Telehealth: Payer: Self-pay

## 2020-11-07 NOTE — Telephone Encounter (Signed)
Post op followup call

## 2020-11-12 ENCOUNTER — Encounter: Payer: Self-pay | Admitting: Podiatry

## 2020-11-12 ENCOUNTER — Ambulatory Visit (INDEPENDENT_AMBULATORY_CARE_PROVIDER_SITE_OTHER): Payer: BC Managed Care – PPO | Admitting: Podiatry

## 2020-11-12 ENCOUNTER — Other Ambulatory Visit: Payer: Self-pay

## 2020-11-12 ENCOUNTER — Ambulatory Visit (INDEPENDENT_AMBULATORY_CARE_PROVIDER_SITE_OTHER): Payer: BC Managed Care – PPO

## 2020-11-12 DIAGNOSIS — M2041 Other hammer toe(s) (acquired), right foot: Secondary | ICD-10-CM

## 2020-11-12 NOTE — Progress Notes (Signed)
Subjective:   Patient ID: Phillip Hodges, male   DOB: 55 y.o.   MRN: [id number]   HPI Patient presents stating he is doing very well with surgery very pleased so far   ROS      Objective:  Physical Exam  Neurovascular status intact negative Denna Haggard' sign noted pins in place digits 2 3 right wound edges well coapted the distal toe in excellent alignment with no pressure on the current lesion formation     Assessment:  Doing well post digital fusion digit to both the     Plan:  H&P reviewed condition and at this point I reapplied sterile dressings advised on open toed surgical shoe to be used and reappoint in 2 weeks suture removal earlier if needed  X-rays indicate the pins are in alignment we were not able to get the third toe perfectly in position but I had to take 2 bones out and that makes it more difficult process

## 2020-11-25 ENCOUNTER — Ambulatory Visit: Payer: BC Managed Care – PPO | Admitting: Nurse Practitioner

## 2020-11-27 ENCOUNTER — Encounter: Payer: BC Managed Care – PPO | Admitting: Podiatry

## 2020-11-28 ENCOUNTER — Other Ambulatory Visit: Payer: Self-pay | Admitting: Podiatry

## 2020-11-28 ENCOUNTER — Ambulatory Visit (INDEPENDENT_AMBULATORY_CARE_PROVIDER_SITE_OTHER): Payer: BC Managed Care – PPO

## 2020-11-28 ENCOUNTER — Other Ambulatory Visit: Payer: Self-pay

## 2020-11-28 ENCOUNTER — Encounter: Payer: Self-pay | Admitting: Podiatry

## 2020-11-28 ENCOUNTER — Ambulatory Visit (INDEPENDENT_AMBULATORY_CARE_PROVIDER_SITE_OTHER): Payer: BC Managed Care – PPO | Admitting: Podiatry

## 2020-11-28 DIAGNOSIS — M2041 Other hammer toe(s) (acquired), right foot: Secondary | ICD-10-CM | POA: Diagnosis not present

## 2020-11-30 NOTE — Progress Notes (Signed)
Subjective:   Patient ID: Phillip Hodges, male   DOB: 55 y.o.   MRN: [id number]   HPI Patient presents doing well with stitches intact wound edges well coapted pin intact second and third digits right   ROS      Objective:  Physical Exam  Second and third digit right good alignment noted pins in place no longer walking on the ends of his toes with sutures intact     Assessment:  Doing well post digital fusion digits 2 3 white with the third pin slightly more proximal     Plan:  Precautionary x-ray taken stitches removed continue keeping the toes plantarflexed and sterile dressings with wide shoe and reappoint to recheck  X-rays intact and show that the pins are in place slight distal migration third right but still holding well

## 2020-12-11 ENCOUNTER — Encounter: Payer: Self-pay | Admitting: Podiatry

## 2020-12-11 ENCOUNTER — Ambulatory Visit (INDEPENDENT_AMBULATORY_CARE_PROVIDER_SITE_OTHER): Payer: BC Managed Care – PPO | Admitting: Podiatry

## 2020-12-11 ENCOUNTER — Ambulatory Visit (INDEPENDENT_AMBULATORY_CARE_PROVIDER_SITE_OTHER): Payer: BC Managed Care – PPO

## 2020-12-11 ENCOUNTER — Other Ambulatory Visit: Payer: Self-pay

## 2020-12-11 DIAGNOSIS — Z9889 Other specified postprocedural states: Secondary | ICD-10-CM | POA: Diagnosis not present

## 2020-12-14 NOTE — Progress Notes (Signed)
Subjective:   Patient ID: Phillip Hodges, male   DOB: 55 y.o.   MRN: [id number]   HPI Patient states doing well with surgery and ready to get pins out very pleased so far   ROS      Objective:  Physical Exam  Neurovascular status intact negative Denna Haggard' sign noted pins intact second and third toes right toes in good position and straight     Assessment:  Doing well post digital fusion digits 2 3 right     Plan:  Sterile removal of pins with sterile dressings applied x-rays reviewed advised on continued elevation compression gradual return to soft shoe gear hopeful return to work in the next 3 to 6 weeks.  Reappoint to reevaluate  X-rays indicate that the toes are in good alignment no signs of pathology slight movement of the third toe but should heal with fibrous intent and is very good clinically

## 2020-12-15 ENCOUNTER — Ambulatory Visit: Payer: BC Managed Care – PPO | Admitting: Nurse Practitioner

## 2021-01-13 ENCOUNTER — Telehealth: Payer: Self-pay | Admitting: Podiatry

## 2021-01-13 NOTE — Telephone Encounter (Signed)
Patient called back to follow up on work note-Wants to return 08/15 due to the swelling in his foot

## 2021-01-13 NOTE — Telephone Encounter (Signed)
Patient called and stated he needed a note to go back to work.

## 2021-01-14 ENCOUNTER — Encounter: Payer: Self-pay | Admitting: Podiatry

## 2021-01-14 NOTE — Telephone Encounter (Signed)
That is fine 

## 2021-05-26 ENCOUNTER — Encounter: Payer: Self-pay | Admitting: Internal Medicine

## 2021-09-06 NOTE — Progress Notes (Deleted)
? ?Referring Provider: Richardean Chimera, MD ?Primary Care Physician:  Richardean Chimera, MD ?Primary Gastroenterologist:  Dr. Jena Gauss ? ?No chief complaint on file. ? ? ?HPI:   ?Phillip Hodges is a 56 y.o. male presenting today at the request of Richardean Chimera, MD for colon cancer screening and elevated LFTs.  ? 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 ? ?Past Medical History:  ?Diagnosis Date  ? Chronic neck pain   ? "bad discs in my neck"  ? Disc disorder   ? buldging  ? Diverticulosis   ? DM (diabetes mellitus) (HCC)   ? Elevated LFTs   ? "for the past 20 years"  ? HTN (hypertension)   ? Syncope   ? recurrent syncope and near syncope "for at least 10 years"  ? ? ?Past Surgical History:  ?Procedure Laterality Date  ? COLONOSCOPY  April 2012  ? Dr. Eliseo Squires sigmoid diverticula  ? KNEE ARTHROCENTESIS    ? both knees  ? ROTATOR CUFF REPAIR W/ DISTAL CLAVICLE EXCISION  2014  ? right side  ? ? ?Current Outpatient Medications  ?Medication Sig Dispense Refill  ? cephALEXin (KEFLEX) 500 MG capsule Take 500 mg by mouth 4 (four) times daily.    ? cyclobenzaprine (FLEXERIL) 10 MG tablet Take 1 tablet (10 mg total) by mouth 2 (two) times daily as needed for muscle spasms. 20 tablet 0  ? DULoxetine (CYMBALTA) 60 MG capsule Take 60 mg by mouth daily.    ? gabapentin (NEURONTIN) 400 MG capsule Take 400 mg by mouth 3 (three) times daily.    ? glipiZIDE (GLUCOTROL) 5 MG tablet Take 5 mg by mouth daily before breakfast.    ? ketorolac (TORADOL) 10 MG tablet Take 1 tablet (10 mg total) by mouth every 6 (six) hours as needed. 10 tablet 0  ? levorphanol (LEVODROMORAN) 2 MG tablet levorphanol tartrate 2 mg tablet ? TAKE ONE TABLET BY MOUTH THREE TIMES DAILY.    ? linaclotide (LINZESS) 145 MCG CAPS capsule Linzess 145 mcg  capsule ? as needed    ? lisinopril-hydrochlorothiazide (PRINZIDE,ZESTORETIC) 10-12.5 MG per tablet Take 1 tablet by mouth daily.    ? metFORMIN (GLUCOPHAGE) 500 MG tablet Take 1,000 mg by mouth 2 (two) times daily with a meal.     ? methocarbamol (ROBAXIN) 500 MG tablet methocarbamol 500 mg tablet    ? morphine (MSIR) 15 MG tablet Take 15 mg by mouth every 12 (twelve) hours.    ? Oxycodone HCl 10 MG TABS Take 10 mg by mouth 4 (four) times daily as needed.    ? oxyCODONE-acetaminophen (PERCOCET) 10-325 MG tablet Take 1 tablet by mouth every 6 (six) hours as needed for pain.    ? tamsulosin (FLOMAX) 0.4 MG CAPS capsule tamsulosin 0.4 mg capsule    ? tiZANidine (ZANAFLEX) 4 MG tablet Take 4 mg by mouth at bedtime.    ? Vitamin D, Ergocalciferol, (DRISDOL) 1.25 MG (50000 UNIT) CAPS capsule Take 1 capsule by mouth once a week.    ? ?No current facility-administered medications for this visit.  ? ? ?Allergies as of 09/07/2021 - Review Complete 12/11/2020  ?Allergen Reaction Noted  ? Penicillins Rash 11/24/2012  ? ? ?Family History  ?Problem Relation Age of Onset  ? Colon cancer Paternal Grandmother   ? Seizures Neg Hx   ?  Stroke Neg Hx   ? Migraines Neg Hx   ? Dementia Neg Hx   ? ? ?Social History  ? ?Socioeconomic History  ? Marital status: Married  ?  Spouse name: camie   ? Number of children: 1  ? Years of education: 55  ? Highest education level: Not on file  ?Occupational History  ? Occupation: [employer]  ?Tobacco Use  ? Smoking status: Never  ? Smokeless tobacco: Never  ?Substance and Sexual Activity  ? Alcohol use: Yes  ?  Comment: socially  ? Drug use: No  ? Sexual activity: Not on file  ?Other Topics Concern  ? Not on file  ?Social History Narrative  ? Lives w/ spouse  ? Caffeine use: Drinks soda rarely   ? ?Social Determinants of Health  ? ?Financial Resource Strain: Not on file  ?Food Insecurity: Not on file  ?Transportation Needs: Not on file  ?Physical Activity: Not on file  ?Stress: Not on file  ?Social  Connections: Not on file  ?Intimate Partner Violence: Not on file  ? ? ?Review of Systems: ?Gen: Denies any fever, chills, fatigue, weight loss, lack of appetite.  ?CV: Denies chest pain, heart palpitations, peripheral edema, syncope.  ?Resp: Denies shortness of breath at rest or with exertion. Denies wheezing or cough.  ?GI: Denies dysphagia or odynophagia. Denies jaundice, hematemesis, fecal incontinence. ?GU : Denies urinary burning, urinary frequency, urinary hesitancy ?MS: Denies joint pain, muscle weakness, cramps, or limitation of movement.  ?Derm: Denies rash, itching, dry skin ?Psych: Denies depression, anxiety, memory loss, and confusion ?Heme: Denies bruising, bleeding, and enlarged lymph nodes. ? ?Physical Exam: ?There were no vitals taken for this visit. ?General:   Alert and oriented. Pleasant and cooperative. Well-nourished and well-developed.  ?Head:  Normocephalic and atraumatic. ?Eyes:  Without icterus, sclera clear and conjunctiva pink.  ?Ears:  Normal auditory acuity. ?Lungs:  Clear to auscultation bilaterally. No wheezes, rales, or rhonchi. No distress.  ?Heart:  S1, S2 present without murmurs appreciated.  ?Abdomen:  +BS, soft, non-tender and non-distended. No HSM noted. No guarding or rebound. No masses appreciated.  ?Rectal:  Deferred  ?Msk:  Symmetrical without gross deformities. Normal posture. ?Extremities:  Without edema. ?Neurologic:  Alert and  oriented x4;  grossly normal neurologically. ?Skin:  Intact without significant lesions or rashes. ?Psych:  Alert and cooperative. Normal mood and affect. ? ? ? ?Assessment:  ? ? ? ?Plan:  ?*** ? ? ?Ermalinda Memos, PA-C ?[facility] ?09/07/2021 ? ? ?

## 2021-09-07 ENCOUNTER — Telehealth: Payer: Self-pay

## 2021-09-07 ENCOUNTER — Ambulatory Visit: Payer: BC Managed Care – PPO | Admitting: Gastroenterology

## 2021-09-07 NOTE — Telephone Encounter (Signed)
I cancelled today's OV and rescheduled. Appointment card mailed. ?

## 2021-09-07 NOTE — Telephone Encounter (Signed)
Pt needs to cancel his appt and reschedule. Was unable to get off of work. ?

## 2021-09-29 ENCOUNTER — Encounter: Payer: Self-pay | Admitting: Gastroenterology

## 2021-09-29 NOTE — Progress Notes (Deleted)
Referring Provider: Caryl Bis, MD Primary Care Physician:  Caryl Bis, MD Primary Gastroenterologist:  Dr. Gala Romney  No chief complaint on file.   HPI:   Phillip Hodges is a 56 y.o. male presenting today at the request of Caryl Bis, MD for colon cancer screening and elevated LFTs.   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  Last labs from 2017 with AST 125, ALT 141, alk phos 116, t bili 1.8.   Today:    Past Medical History:  Diagnosis Date   Chronic neck pain    "bad discs in my neck"   Disc disorder    buldging   Diverticulosis    DM (diabetes mellitus) ([place])    Elevated LFTs    "for the past 20 years"   HTN (hypertension)    Syncope    recurrent syncope and near syncope "for at least 10 years"    Past Surgical History:  Procedure Laterality Date   COLONOSCOPY  April 2012   Dr. Yaakov Guthrie sigmoid diverticula   KNEE ARTHROCENTESIS     both knees   ROTATOR CUFF REPAIR W/ DISTAL CLAVICLE EXCISION  2014   right side    Current Outpatient Medications  Medication Sig Dispense Refill   cephALEXin (KEFLEX) 500 MG capsule Take 500 mg by mouth 4 (four) times daily.     cyclobenzaprine (FLEXERIL) 10 MG tablet Take 1 tablet (10 mg total) by mouth 2 (two) times daily as needed for muscle spasms. 20 tablet 0   DULoxetine (CYMBALTA) 60 MG capsule Take 60 mg by mouth daily.     gabapentin (NEURONTIN) 400 MG capsule Take 400 mg by mouth 3 (three) times daily.     glipiZIDE (GLUCOTROL) 5 MG tablet Take 5 mg by mouth daily before breakfast.     ketorolac (TORADOL) 10 MG tablet Take 1 tablet (10 mg total) by mouth every 6 (six) hours as needed. 10 tablet 0   levorphanol (LEVODROMORAN) 2 MG tablet levorphanol tartrate 2 mg tablet  TAKE ONE TABLET BY MOUTH  THREE TIMES DAILY.     linaclotide (LINZESS) 145 MCG CAPS capsule Linzess 145 mcg capsule  as needed     lisinopril-hydrochlorothiazide (PRINZIDE,ZESTORETIC) 10-12.5 MG per tablet Take 1 tablet by mouth daily.     metFORMIN (GLUCOPHAGE) 500 MG tablet Take 1,000 mg by mouth 2 (two) times daily with a meal.      methocarbamol (ROBAXIN) 500 MG tablet methocarbamol 500 mg tablet     morphine (MSIR) 15 MG tablet Take 15 mg by mouth every 12 (twelve) hours.     Oxycodone HCl 10 MG TABS Take 10 mg by mouth 4 (four) times daily as needed.     oxyCODONE-acetaminophen (PERCOCET) 10-325 MG tablet Take 1 tablet by mouth every 6 (six) hours as needed for pain.     tamsulosin (FLOMAX) 0.4 MG CAPS capsule tamsulosin 0.4 mg capsule     tiZANidine (ZANAFLEX) 4 MG tablet Take 4 mg by mouth at bedtime.     Vitamin D, Ergocalciferol, (DRISDOL) 1.25 MG (50000 UNIT) CAPS capsule Take 1 capsule by mouth once a week.     No current facility-administered medications for this visit.    Allergies as of 10/01/2021 - Review Complete 12/11/2020  Allergen Reaction Noted   Penicillins Rash 11/24/2012  Family History  Problem Relation Age of Onset   Colon cancer Paternal Grandmother    Seizures Neg Hx    Stroke Neg Hx    Migraines Neg Hx    Dementia Neg Hx     Social History   Socioeconomic History   Marital status: Married    Spouse name: camie    Number of children: 1   Years of education: 12   Highest education level: Not on file  Occupational History   Occupation: [employer]   Tobacco Use   Smoking status: Never   Smokeless tobacco: Never  Substance and Sexual Activity   Alcohol use: Yes    Comment: socially   Drug use: No   Sexual activity: Not on file  Other Topics Concern   Not on file  Social History Narrative   Lives w/ spouse   Caffeine use: Drinks soda rarely    Social Determinants of Health   Financial Resource Strain: Not on file  Food Insecurity: Not on file  Transportation  Needs: Not on file  Physical Activity: Not on file  Stress: Not on file  Social Connections: Not on file  Intimate Partner Violence: Not on file    Review of Systems: Gen: Denies any fever, chills, fatigue, weight loss, lack of appetite.  CV: Denies chest pain, heart palpitations, peripheral edema, syncope.  Resp: Denies shortness of breath at rest or with exertion. Denies wheezing or cough.  GI: Denies dysphagia or odynophagia. Denies jaundice, hematemesis, fecal incontinence. GU : Denies urinary burning, urinary frequency, urinary hesitancy MS: Denies joint pain, muscle weakness, cramps, or limitation of movement.  Derm: Denies rash, itching, dry skin Psych: Denies depression, anxiety, memory loss, and confusion Heme: Denies bruising, bleeding, and enlarged lymph nodes.  Physical Exam: There were no vitals taken for this visit. General:   Alert and oriented. Pleasant and cooperative. Well-nourished and well-developed.  Head:  Normocephalic and atraumatic. Eyes:  Without icterus, sclera clear and conjunctiva pink.  Ears:  Normal auditory acuity. Lungs:  Clear to auscultation bilaterally. No wheezes, rales, or rhonchi. No distress.  Heart:  S1, S2 present without murmurs appreciated.  Abdomen:  +BS, soft, non-tender and non-distended. No HSM noted. No guarding or rebound. No masses appreciated.  Rectal:  Deferred  Msk:  Symmetrical without gross deformities. Normal posture. Extremities:  Without edema. Neurologic:  Alert and  oriented x4;  grossly normal neurologically. Skin:  Intact without significant lesions or rashes. Psych:  Alert and cooperative. Normal mood and affect.    Assessment:     Plan:  ***   Aliene Altes, PA-C [facility] 10/01/2021

## 2021-10-01 ENCOUNTER — Ambulatory Visit: Payer: BC Managed Care – PPO | Admitting: Gastroenterology

## 2022-11-25 ENCOUNTER — Other Ambulatory Visit (HOSPITAL_COMMUNITY): Payer: Self-pay | Admitting: Physician Assistant

## 2022-11-25 DIAGNOSIS — K828 Other specified diseases of gallbladder: Secondary | ICD-10-CM

## 2023-01-05 ENCOUNTER — Emergency Department (HOSPITAL_COMMUNITY): Payer: BC Managed Care – PPO

## 2023-01-05 ENCOUNTER — Emergency Department (HOSPITAL_COMMUNITY)
Admission: EM | Admit: 2023-01-05 | Discharge: 2023-01-05 | Disposition: A | Discharge status: Home / Self Care | Payer: BC Managed Care – PPO | Attending: Emergency Medicine | Admitting: Emergency Medicine

## 2023-01-05 DIAGNOSIS — K5900 Constipation, unspecified: Secondary | ICD-10-CM | POA: Diagnosis present

## 2023-01-05 DIAGNOSIS — Z7984 Long term (current) use of oral hypoglycemic drugs: Secondary | ICD-10-CM | POA: Insufficient documentation

## 2023-01-05 DIAGNOSIS — Z79899 Other long term (current) drug therapy: Secondary | ICD-10-CM | POA: Insufficient documentation

## 2023-01-05 DIAGNOSIS — K5903 Drug induced constipation: Secondary | ICD-10-CM | POA: Diagnosis not present

## 2023-01-05 NOTE — ED Notes (Signed)
Pt was able to use the bathroom. Verbalized complete relief of symptoms after BM. EDP at bedside.

## 2023-01-05 NOTE — ED Notes (Signed)
Pt ambulatory to the bathroom attempting to have BM. Gait steady.

## 2023-01-05 NOTE — ED Provider Notes (Signed)
[place] EMERGENCY DEPARTMENT AT [facility] Provider Note   CSN: [id number] Arrival date & time: 01/05/23  0016     History  Chief Complaint  Patient presents with   Constipation    Phillip Hodges is a 57 y.o. male.  Patient presents to the emergency department for evaluation of constipation.  Patient reports that he has been trying to have a bowel movement for the last hour and feels like it is stuck at the rectum.  Patient complaining of significant pain.  He does have a history of chronic constipation secondary to pain medications.  He normally takes Linzess several days a week to help with this, did not take it in the last week.       Home Medications Prior to Admission medications   Medication Sig Start Date End Date Taking? Authorizing Provider  cephALEXin (KEFLEX) 500 MG capsule Take 500 mg by mouth 4 (four) times daily. 11/10/20   [provider]  cyclobenzaprine (FLEXERIL) 10 MG tablet Take 1 tablet (10 mg total) by mouth 2 (two) times daily as needed for muscle spasms. 10/15/16   Jacalyn Lefevre, MD  DULoxetine (CYMBALTA) 60 MG capsule Take 60 mg by mouth daily.    [provider]  gabapentin (NEURONTIN) 400 MG capsule Take 400 mg by mouth 3 (three) times daily.    [provider]  glipiZIDE (GLUCOTROL) 5 MG tablet Take 5 mg by mouth daily before breakfast.    [provider]  ketorolac (TORADOL) 10 MG tablet Take 1 tablet (10 mg total) by mouth every 6 (six) hours as needed. 10/15/16   Jacalyn Lefevre, MD  levorphanol (LEVODROMORAN) 2 MG tablet levorphanol tartrate 2 mg tablet  TAKE ONE TABLET BY MOUTH THREE TIMES DAILY.    [provider]  linaclotide (LINZESS) 145 MCG CAPS capsule Linzess 145 mcg capsule  as needed    [provider]  lisinopril-hydrochlorothiazide (PRINZIDE,ZESTORETIC) 10-12.5 MG per tablet Take 1 tablet by mouth daily.    [provider]  metFORMIN (GLUCOPHAGE) 500 MG tablet  Take 1,000 mg by mouth 2 (two) times daily with a meal.     [provider]  methocarbamol (ROBAXIN) 500 MG tablet methocarbamol 500 mg tablet    [provider]  morphine (MSIR) 15 MG tablet Take 15 mg by mouth every 12 (twelve) hours.    [provider]  Oxycodone HCl 10 MG TABS Take 10 mg by mouth 4 (four) times daily as needed. 11/08/20   [provider]  oxyCODONE-acetaminophen (PERCOCET) 10-325 MG tablet Take 1 tablet by mouth every 6 (six) hours as needed for pain.    [provider]  tamsulosin (FLOMAX) 0.4 MG CAPS capsule tamsulosin 0.4 mg capsule    [provider]  tiZANidine (ZANAFLEX) 4 MG tablet Take 4 mg by mouth at bedtime.    [provider]  Vitamin D, Ergocalciferol, (DRISDOL) 1.25 MG (50000 UNIT) CAPS capsule Take 1 capsule by mouth once a week. 07/28/20   [provider]      Allergies    Penicillins    Review of Systems   Review of Systems  Physical Exam Updated Vital Signs BP (!) 168/101   Pulse 95   Temp 98.9 F (37.2 C) (Oral)   Resp 18   Ht 6' (1.829 m)   Wt 106 kg   SpO2 94%   BMI 31.69 kg/m  Physical Exam Vitals and nursing note reviewed.  Constitutional:  General: He is not in acute distress.    Appearance: He is well-developed.  HENT:     Head: Normocephalic and atraumatic.     Mouth/Throat:     Mouth: Mucous membranes are moist.  Eyes:     General: Vision grossly intact. Gaze aligned appropriately.     Extraocular Movements: Extraocular movements intact.     Conjunctiva/sclera: Conjunctivae normal.  Cardiovascular:     Rate and Rhythm: Normal rate and regular rhythm.     Pulses: Normal pulses.     Heart sounds: Normal heart sounds, S1 normal and S2 normal. No murmur heard.    No friction rub. No gallop.  Pulmonary:     Effort: Pulmonary effort is normal. No respiratory distress.     Breath sounds: Normal breath sounds.  Abdominal:     Palpations: Abdomen is soft.      Tenderness: There is no abdominal tenderness. There is no guarding or rebound.     Hernia: No hernia is present.  Musculoskeletal:        General: No swelling.     Cervical back: Full passive range of motion without pain, normal range of motion and neck supple. No pain with movement, spinous process tenderness or muscular tenderness. Normal range of motion.     Right lower leg: No edema.     Left lower leg: No edema.  Skin:    General: Skin is warm and dry.     Capillary Refill: Capillary refill takes less than 2 seconds.     Findings: No ecchymosis, erythema, lesion or wound.  Neurological:     Mental Status: He is alert and oriented to person, place, and time.     GCS: GCS eye subscore is 4. GCS verbal subscore is 5. GCS motor subscore is 6.     Cranial Nerves: Cranial nerves 2-12 are intact.     Sensory: Sensation is intact.     Motor: Motor function is intact. No weakness or abnormal muscle tone.     Coordination: Coordination is intact.  Psychiatric:        Mood and Affect: Mood normal.        Speech: Speech normal.        Behavior: Behavior normal.     ED Results / Procedures / Treatments   Labs (all labs ordered are listed, but only abnormal results are displayed) Labs Reviewed - No data to display  EKG None  Radiology No results found.  Procedures Procedures    Medications Ordered in ED Medications - No data to display  ED Course/ Medical Decision Making/ A&P                                 Medical Decision Making Amount and/or Complexity of Data Reviewed Radiology: ordered.   Presents to the emergency department for evaluation of rectal pain secondary to constipation.  After triage and prior to making it back to the exam room, however, he was able to have a bowel movement and has had significant relief.  X-ray without obstruction.        Final Clinical Impression(s) / ED Diagnoses Final diagnoses:  Drug-induced constipation    Rx / DC  Orders ED Discharge Orders     None         Amaris Delafuente, Canary Brim, MD 01/05/23 0145

## 2023-01-05 NOTE — ED Triage Notes (Signed)
Pt states he has been constipated for 3 days, has been trying to have a bowel movement for the last hour. Reports there is stool in rectum that will not come out and is now unable to sit d/t pain.

## 2023-01-05 NOTE — ED Notes (Signed)
Discharge instructions provided by edp were reinforced to pt. Pt verbalized understanding with no additional questions at this time. Pt to go home with family at bedside

## 2023-10-11 LAB — BASIC METABOLIC PANEL WITH GFR
BUN: 16 (ref 4–21)
Creatinine: 0.9 (ref 0.6–1.3)
Glucose: 204

## 2023-10-11 LAB — HEPATIC FUNCTION PANEL
ALT: 73 U/L — AB (ref 10–40)
AST: 50 — AB (ref 14–40)
Alkaline Phosphatase: 132 — AB (ref 25–125)

## 2023-10-11 LAB — LIPID PANEL
LDL Cholesterol: 75
Triglycerides: 576 — AB (ref 40–160)

## 2023-10-11 LAB — COMPREHENSIVE METABOLIC PANEL WITH GFR: eGFR: 96

## 2023-10-11 LAB — HEMOGLOBIN A1C: Hemoglobin A1C: 10.6

## 2023-11-02 ENCOUNTER — Encounter (INDEPENDENT_AMBULATORY_CARE_PROVIDER_SITE_OTHER): Payer: Self-pay | Admitting: *Deleted

## 2023-11-10 ENCOUNTER — Encounter (INDEPENDENT_AMBULATORY_CARE_PROVIDER_SITE_OTHER): Payer: Self-pay | Admitting: *Deleted

## 2023-11-23 ENCOUNTER — Ambulatory Visit (INDEPENDENT_AMBULATORY_CARE_PROVIDER_SITE_OTHER): Admitting: Gastroenterology

## 2023-11-30 ENCOUNTER — Encounter (INDEPENDENT_AMBULATORY_CARE_PROVIDER_SITE_OTHER): Payer: Self-pay | Admitting: Gastroenterology

## 2023-11-30 ENCOUNTER — Telehealth: Payer: Self-pay | Admitting: *Deleted

## 2023-11-30 ENCOUNTER — Ambulatory Visit (INDEPENDENT_AMBULATORY_CARE_PROVIDER_SITE_OTHER): Admitting: Gastroenterology

## 2023-11-30 VITALS — BP 114/69 | HR 64 | Temp 98.1°F | Ht 72.0 in | Wt 227.1 lb

## 2023-11-30 DIAGNOSIS — Z1211 Encounter for screening for malignant neoplasm of colon: Secondary | ICD-10-CM | POA: Insufficient documentation

## 2023-11-30 DIAGNOSIS — K760 Fatty (change of) liver, not elsewhere classified: Secondary | ICD-10-CM | POA: Diagnosis not present

## 2023-11-30 DIAGNOSIS — Z6830 Body mass index (BMI) 30.0-30.9, adult: Secondary | ICD-10-CM | POA: Insufficient documentation

## 2023-11-30 DIAGNOSIS — R748 Abnormal levels of other serum enzymes: Secondary | ICD-10-CM | POA: Insufficient documentation

## 2023-11-30 DIAGNOSIS — R7989 Other specified abnormal findings of blood chemistry: Secondary | ICD-10-CM | POA: Diagnosis not present

## 2023-11-30 NOTE — Progress Notes (Signed)
 Loriene Taunton Faizan Amoree Newlon , M.D. Gastroenterology & Hepatology [facility] Gastroenterology [address] [place], [place] [address] Primary Care Physician: Toribio Jerel MATSU, MD [address] [place] [address]  Chief Complaint: Elevated liver enzymes, colon cancer screening  History of Present Illness: Phillip Hodges is a 58 y.o. male with history of diverticulitis, diabetes, hypertension who presents for evaluation of elevated liver enzymes  Patient denies any generalized itching or yellowing of the skin.  Denies using any herbal medications or newly added medications not on the list.  Does not have any family history of liver issues. The patient denies having any nausea, vomiting, fever, chills, hematochezia, melena, hematemesis, abdominal distention, abdominal pain, diarrhea, jaundice, pruritus or weight loss.  Denies using any alcohol   Labs with hemoglobin 14.6 platelet 152 ALT 73 AST 50 T. bili 0.8 alkaline phosphatase 132 albumin 4.6  Hemoglobin A1c 10.60 LDL 75 triglyceride 576  Last ZHI:wnwz Last Colonoscopy: 2012 at [facility].  Repeat 10 years  FHx: Colon cancer paternal grandmother Social: neg smoking, alcohol  or illicit drug use Surgical: no abdominal surgeries  Past Medical History: Past Medical History:  Diagnosis Date   Chronic neck pain    bad discs in my neck   Disc disorder    buldging   Diverticulosis    DM (diabetes mellitus) (HCC)    Elevated LFTs    for the past 20 years   HTN (hypertension)    Syncope    recurrent syncope and near syncope for at least 10 years    Past Surgical History: Past Surgical History:  Procedure Laterality Date   COLONOSCOPY  10/2010   Dr. Criselda sigmoid diverticula   KNEE ARTHROCENTESIS     both knees   ROTATOR CUFF REPAIR W/ DISTAL CLAVICLE EXCISION  06/07/2012   right side    Family History: Family History  Problem Relation Age of Onset   Colon cancer Paternal Grandmother     Seizures Neg Hx    Stroke Neg Hx    Migraines Neg Hx    Dementia Neg Hx     Social History: Social History   Tobacco Use  Smoking Status Never  Smokeless Tobacco Never   Social History   Substance and Sexual Activity  Alcohol  Use Yes   Comment: socially   Social History   Substance and Sexual Activity  Drug Use No    Allergies: Allergies  Allergen Reactions   Penicillins Rash    Medications: Current Outpatient Medications  Medication Sig Dispense Refill   cyclobenzaprine  (FLEXERIL ) 10 MG tablet Take 1 tablet (10 mg total) by mouth 2 (two) times daily as needed for muscle spasms. 20 tablet 0   DULoxetine (CYMBALTA) 60 MG capsule Take 60 mg by mouth daily.     gabapentin (NEURONTIN) 400 MG capsule Take 400 mg by mouth 3 (three) times daily.     glipiZIDE (GLUCOTROL) 5 MG tablet Take 5 mg by mouth daily before breakfast.     linaclotide (LINZESS) 145 MCG CAPS capsule Linzess 145 mcg capsule  as needed     lisinopril-hydrochlorothiazide (PRINZIDE,ZESTORETIC) 10-12.5 MG per tablet Take 1 tablet by mouth daily.     metFORMIN (GLUCOPHAGE) 500 MG tablet Take 1,000 mg by mouth 2 (two) times daily with a meal.      morphine (MSIR) 15 MG tablet Take 15 mg by mouth every 12 (twelve) hours.     Oxycodone HCl 10 MG TABS Take 10 mg by mouth 4 (four) times daily  as needed.     Vitamin D, Ergocalciferol, (DRISDOL) 1.25 MG (50000 UNIT) CAPS capsule Take 1 capsule by mouth once a week.     No current facility-administered medications for this visit.    Review of Systems: GENERAL: negative for malaise, night sweats HEENT: No changes in hearing or vision, no nose bleeds or other nasal problems. NECK: Negative for lumps, goiter, pain and significant neck swelling RESPIRATORY: Negative for cough, wheezing CARDIOVASCULAR: Negative for chest pain, leg swelling, palpitations, orthopnea GI: SEE HPI MUSCULOSKELETAL: Negative for joint pain or swelling, back pain, and muscle pain. SKIN:  Negative for lesions, rash HEMATOLOGY Negative for prolonged bleeding, bruising easily, and swollen nodes. ENDOCRINE: Negative for cold or heat intolerance, polyuria, polydipsia and goiter. NEURO: negative for tremor, gait imbalance, syncope and seizures. The remainder of the review of systems is noncontributory.   Physical Exam: BP 114/69   Pulse 64   Temp 98.1 F (36.7 C)   Ht 6' (1.829 m)   Wt 227 lb 1.6 oz (103 kg)   BMI 30.80 kg/m  GENERAL: The patient is AO x3, in no acute distress. HEENT: Head is normocephalic and atraumatic. EOMI are intact. Mouth is well hydrated and without lesions. NECK: Supple. No masses LUNGS: Clear to auscultation. No presence of rhonchi/wheezing/rales. Adequate chest expansion HEART: RRR, normal s1 and s2. ABDOMEN: Soft, nontender, no guarding, no peritoneal signs, and nondistended. BS +. No masses.    Imaging/Labs: as above  2022 1. Diffuse increased echogenicity of the hepatic parenchyma is a  nonspecific indicator of hepatocellular dysfunction, most commonly  steatosis.  2. Limited visualization of multiple structures due to shadowing  bowel gas. There is apparent dilatation of the mid abdominal aorta  measuring up to 3.6 cm, however this measurement is limited due to  suboptimal visualization. Repeat evaluation with abdominal aortic  ultrasound or CT should be performed to confirm size of the  abdominal aorta.  3. Splenomegaly.      Latest Ref Rng & Units 10/13/2015    5:18 PM  CBC  WBC 4.0 - 10.5 K/uL 8.0   Hemoglobin 13.0 - 17.0 g/dL 83.8   Hematocrit 60.9 - 52.0 % 45.7   Platelets 150 - 400 K/uL 220    No results found for: IRON, TIBC, FERRITIN  I personally reviewed and interpreted the available labs, imaging and endoscopic files.  Impression and Plan: Phillip Hodges is a 58 y.o. male with history of diverticulitis, diabetes, hypertension who presents for evaluation of elevated liver enzymes   #Elevated liver  enzymes Hepatocellular pattern liver enzymes elevation   ALT 73 AST 50 T. 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     On exam patient does not have signs of advanced chronic liver disease, no splenomegaly, ascites, spider angiomas, palmar eythema    Will evaluate for CSPH (clinically significant portal hypertension)/degree of fibrosis  with Fibroscan/elastrography    Given elevation in ALT and alk phos will obtain baseline viral hepatitis profile, AMA, and autoimmune serologies and elastography to assess degree of fibrosis  May benefit from resting from Rezdiffra in future   #BMI30       - walking at a brisk pace/biking at moderate intesity 2.5-5 hours per week     - use pedometer/step counter to track activity     - goal to lose 5-10% of initial body weight     - avoid suagry drinks and juices, use zero calorie beverages     -  increase water intake     - eat a low carb diet with plenty of veggies and fruit     - Get sufficient sleep 7-8 hrs nightly     - maitain active lifestyle     - avoid alcohol      - recommend 2-3 cups Coffee daily     - Counsel on lowering cholesterol by having a diet rich in vegetables,          protein (avoid red meats) and good fats(fish, salmon).    #MASLD - Risk factors include BMI 30, DM II, hypertriglyceridemia - Goal weight loss 22 lbs over the next year (2 lbs per month) - Low glycemic diet, aerobic exercise   #Colon cancer screening  The patient was counseled regarding the importance of colorectal cancer screening, particularly starting at age 58 due to the rising incidence of colorectal cancer in younger individuals. The benefits of screening include early detection of colorectal cancer and precancerous polyps, which can improve treatment outcomes and reduce mortality. Risks associated with screening, particularly colonoscopy, include potential complications such as bleeding and perforation. After deciding different  modalities for screening for colon cancer , patient has opted to pursue Cologuard as two step test   Patient reports having a negative cologuard last year , will obtain results    All questions were answered.      Bohden Dung Faizan Latham Kinzler, MD Gastroenterology and Hepatology [facility] Gastroenterology   This chart has been completed using [facility] Dictation software, and while attempts have been made to ensure accuracy , certain words and phrases may not be transcribed as intended

## 2023-11-30 NOTE — Patient Instructions (Signed)
 It was very nice to meet you today, as dicussed with will plan for the following :  1) labs with ultrasound  2)Loose weight      - walking at a brisk pace/biking at moderate intesity 2.5-5 hours per week     - use pedometer/step counter to track activity     - goal to lose 5-10% of initial body weight     - avoid suagry drinks and juices, use zero calorie beverages     - increase water intake     - eat a low carb diet with plenty of veggies and fruit     - Get sufficient sleep 7-8 hrs nightly     - maitain active lifestyle     - avoid alcohol      - recommend 2-3 cups Coffee daily     - Counsel on lowering cholesterol by having a diet rich in vegetables,          protein (avoid red meats) and good fats(fish, salmon).

## 2023-11-30 NOTE — Telephone Encounter (Signed)
 LMOVM to call back to give US  appt details

## 2023-12-01 NOTE — Telephone Encounter (Signed)
Pt aware of [place] appt details.

## 2023-12-07 ENCOUNTER — Ambulatory Visit (HOSPITAL_COMMUNITY)

## 2023-12-12 ENCOUNTER — Ambulatory Visit (HOSPITAL_COMMUNITY)
Admission: RE | Admit: 2023-12-12 | Discharge: 2023-12-12 | Disposition: A | Discharge status: Home / Self Care | Source: Ambulatory Visit | Attending: Gastroenterology | Admitting: Gastroenterology

## 2023-12-12 DIAGNOSIS — Z6830 Body mass index (BMI) 30.0-30.9, adult: Secondary | ICD-10-CM | POA: Diagnosis present

## 2023-12-12 DIAGNOSIS — K760 Fatty (change of) liver, not elsewhere classified: Secondary | ICD-10-CM | POA: Diagnosis present

## 2023-12-12 DIAGNOSIS — R748 Abnormal levels of other serum enzymes: Secondary | ICD-10-CM | POA: Insufficient documentation

## 2023-12-14 ENCOUNTER — Ambulatory Visit (INDEPENDENT_AMBULATORY_CARE_PROVIDER_SITE_OTHER): Payer: Self-pay | Admitting: Gastroenterology

## 2023-12-14 NOTE — Progress Notes (Signed)
 Hi Phillip Hodges ,  Can you please call the patient and tell the patient the ultrasound shows fatty liver and suggest possibly advance scarring of the liver . It is very important that we continue to see you in clinic to get your liver disease under control . For now I am awaiting labs results and important to avoid any alcohol  and continue with healthy lifestyle with goal of losing weight   Thanks,  Kapena Hamme Faizan Donterius Filley, MD Gastroenterology and Hepatology [facility] Gastroenterology  ==============  IMPRESSION: ULTRASOUND ABDOMEN:   Increased liver echogenicity.  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

## 2024-01-19 ENCOUNTER — Encounter (INDEPENDENT_AMBULATORY_CARE_PROVIDER_SITE_OTHER): Payer: Self-pay | Admitting: Gastroenterology

## 2024-05-24 NOTE — Patient Instructions (Signed)

## 2024-05-29 ENCOUNTER — Encounter (INDEPENDENT_AMBULATORY_CARE_PROVIDER_SITE_OTHER): Admitting: Nurse Practitioner

## 2024-05-29 DIAGNOSIS — Z794 Long term (current) use of insulin: Secondary | ICD-10-CM

## 2024-05-29 DIAGNOSIS — E1165 Type 2 diabetes mellitus with hyperglycemia: Secondary | ICD-10-CM

## 2024-05-29 NOTE — Progress Notes (Signed)
 Erroneous encounter

## 2024-08-30 ENCOUNTER — Ambulatory Visit: Admitting: Nurse Practitioner
